# Patient Record
Sex: Male | Born: 1970 | Race: White | Hispanic: No | Marital: Married | Smoking: Never smoker
Health system: Southern US, Community
[De-identification: ages and names within clinical notes are randomized; demographics above are authoritative.]

## PROBLEM LIST (undated history)

## (undated) DIAGNOSIS — E039 Hypothyroidism, unspecified: Secondary | ICD-10-CM

## (undated) DIAGNOSIS — Z8614 Personal history of Methicillin resistant Staphylococcus aureus infection: Secondary | ICD-10-CM

## (undated) DIAGNOSIS — K219 Gastro-esophageal reflux disease without esophagitis: Secondary | ICD-10-CM

## (undated) DIAGNOSIS — M51379 Other intervertebral disc degeneration, lumbosacral region without mention of lumbar back pain or lower extremity pain: Secondary | ICD-10-CM

## (undated) DIAGNOSIS — M4807 Spinal stenosis, lumbosacral region: Secondary | ICD-10-CM

## (undated) DIAGNOSIS — M109 Gout, unspecified: Secondary | ICD-10-CM

## (undated) DIAGNOSIS — Z87442 Personal history of urinary calculi: Secondary | ICD-10-CM

## (undated) DIAGNOSIS — F419 Anxiety disorder, unspecified: Secondary | ICD-10-CM

## (undated) DIAGNOSIS — K760 Fatty (change of) liver, not elsewhere classified: Secondary | ICD-10-CM

## (undated) DIAGNOSIS — K439 Ventral hernia without obstruction or gangrene: Secondary | ICD-10-CM

## (undated) DIAGNOSIS — G4733 Obstructive sleep apnea (adult) (pediatric): Secondary | ICD-10-CM

## (undated) DIAGNOSIS — K7460 Unspecified cirrhosis of liver: Secondary | ICD-10-CM

## (undated) DIAGNOSIS — E079 Disorder of thyroid, unspecified: Secondary | ICD-10-CM

## (undated) HISTORY — PX: VASECTOMY: SHX75

---

## 2004-04-07 HISTORY — PX: TESTICLE TORSION REDUCTION: SHX795

## 2012-01-28 ENCOUNTER — Ambulatory Visit: Payer: Self-pay | Admitting: Family Medicine

## 2012-01-28 LAB — RAPID STREP-A WITH REFLX: Micro Text Report: NEGATIVE

## 2012-02-29 ENCOUNTER — Ambulatory Visit: Payer: Self-pay

## 2012-08-17 ENCOUNTER — Emergency Department: Payer: Self-pay | Admitting: Emergency Medicine

## 2013-02-19 ENCOUNTER — Emergency Department: Payer: Self-pay | Admitting: Emergency Medicine

## 2013-09-23 ENCOUNTER — Ambulatory Visit: Payer: Self-pay | Admitting: Internal Medicine

## 2014-04-07 DIAGNOSIS — M5126 Other intervertebral disc displacement, lumbar region: Secondary | ICD-10-CM

## 2014-04-07 HISTORY — DX: Other intervertebral disc displacement, lumbar region: M51.26

## 2015-05-16 ENCOUNTER — Ambulatory Visit
Admission: EM | Admit: 2015-05-16 | Discharge: 2015-05-16 | Disposition: A | Discharge status: Home / Self Care | Payer: BC Managed Care – PPO | Attending: Family Medicine | Admitting: Family Medicine

## 2015-05-16 ENCOUNTER — Encounter: Payer: Self-pay | Admitting: Emergency Medicine

## 2015-05-16 DIAGNOSIS — J111 Influenza due to unidentified influenza virus with other respiratory manifestations: Secondary | ICD-10-CM | POA: Diagnosis not present

## 2015-05-16 DIAGNOSIS — J069 Acute upper respiratory infection, unspecified: Secondary | ICD-10-CM

## 2015-05-16 HISTORY — DX: Disorder of thyroid, unspecified: E07.9

## 2015-05-16 LAB — RAPID INFLUENZA A&B ANTIGENS (ARMC ONLY)
INFLUENZA A (ARMC): DETECTED
INFLUENZA B (ARMC): NOT DETECTED

## 2015-05-16 LAB — RAPID STREP SCREEN (MED CTR MEBANE ONLY): Streptococcus, Group A Screen (Direct): NEGATIVE

## 2015-05-16 MED ORDER — HYDROCOD POLST-CPM POLST ER 10-8 MG/5ML PO SUER: 5.0000 mL | Freq: Two times a day (BID) | ORAL | Status: DC | PRN

## 2015-05-16 MED ORDER — FEXOFENADINE-PSEUDOEPHED ER 180-240 MG PO TB24: 1.0000 | ORAL_TABLET | Freq: Every day | ORAL | Status: DC

## 2015-05-16 MED ORDER — OSELTAMIVIR PHOSPHATE 75 MG PO CAPS: 75.0000 mg | ORAL_CAPSULE | Freq: Two times a day (BID) | ORAL | Status: DC

## 2015-05-16 NOTE — ED Notes (Signed)
Patient c/o sore throat, cough, sneezing, bodyaches for the past 4 days.

## 2015-05-16 NOTE — Discharge Instructions (Signed)
Influenza, Adult Influenza (flu) is an infection in the mouth, nose, and throat (respiratory tract) caused by a virus. The flu can make you feel very ill. Influenza spreads easily from person to person (contagious).  HOME CARE   Only take medicines as told by your doctor.  Use a cool mist humidifier to make breathing easier.  Get plenty of rest until your fever goes away. This usually takes 3 to 4 days.  Drink enough fluids to keep your pee (urine) clear or pale yellow.  Cover your mouth and nose when you cough or sneeze.  Wash your hands well to avoid spreading the flu.  Stay home from work or school until your fever has been gone for at least 1 full day.  Get a flu shot every year. GET HELP RIGHT AWAY IF:   You have trouble breathing or feel short of breath.  Your skin or nails turn blue.  You have severe neck pain or stiffness.  You have a severe headache, facial pain, or earache.  Your fever gets worse or keeps coming back.  You feel sick to your stomach (nauseous), throw up (vomit), or have watery poop (diarrhea).  You have chest pain.  You have a deep cough that gets worse, or you cough up more thick spit (mucus). MAKE SURE YOU:   Understand these instructions.  Will watch your condition.  Will get help right away if you are not doing well or get worse.   This information is not intended to replace advice given to you by your health care provider. Make sure you discuss any questions you have with your health care provider.   Document Released: 01/01/2008 Document Revised: 04/14/2014 Document Reviewed: 06/23/2011 Elsevier Interactive Patient Education 2016 Elsevier Inc.  Pharyngitis Pharyngitis is a sore throat (pharynx). There is redness, pain, and swelling of your throat. HOME CARE   Drink enough fluids to keep your pee (urine) clear or pale yellow.  Only take medicine as told by your doctor.  You may get sick again if you do not take medicine as told.  Finish your medicines, even if you start to feel better.  Do not take aspirin.  Rest.  Rinse your mouth (gargle) with salt water ( tsp of salt per 1 qt of water) every 1-2 hours. This will help the pain.  If you are not at risk for choking, you can suck on hard candy or sore throat lozenges. GET HELP IF:  You have large, tender lumps on your neck.  You have a rash.  You cough up green, yellow-brown, or bloody spit. GET HELP RIGHT AWAY IF:   You have a stiff neck.  You drool or cannot swallow liquids.  You throw up (vomit) or are not able to keep medicine or liquids down.  You have very bad pain that does not go away with medicine.  You have problems breathing (not from a stuffy nose). MAKE SURE YOU:   Understand these instructions.  Will watch your condition.  Will get help right away if you are not doing well or get worse.   This information is not intended to replace advice given to you by your health care provider. Make sure you discuss any questions you have with your health care provider.   Document Released: 09/10/2007 Document Revised: 01/12/2013 Document Reviewed: 11/29/2012 Elsevier Interactive Patient Education 2016 Elsevier Inc.  Upper Respiratory Infection, Adult Most upper respiratory infections (URIs) are caused by a virus. A URI affects the nose, throat, and  upper air passages. The most common type of URI is often called "the common cold." HOME CARE   Take medicines only as told by your doctor.  Gargle warm saltwater or take cough drops to comfort your throat as told by your doctor.  Use a warm mist humidifier or inhale steam from a shower to increase air moisture. This may make it easier to breathe.  Drink enough fluid to keep your pee (urine) clear or pale yellow.  Eat soups and other clear broths.  Have a healthy diet.  Rest as needed.  Go back to work when your fever is gone or your doctor says it is okay.  You may need to stay home  longer to avoid giving your URI to others.  You can also wear a face mask and wash your hands often to prevent spread of the virus.  Use your inhaler more if you have asthma.  Do not use any tobacco products, including cigarettes, chewing tobacco, or electronic cigarettes. If you need help quitting, ask your doctor. GET HELP IF:  You are getting worse, not better.  Your symptoms are not helped by medicine.  You have chills.  You are getting more short of breath.  You have brown or red mucus.  You have yellow or brown discharge from your nose.  You have pain in your face, especially when you bend forward.  You have a fever.  You have puffy (swollen) neck glands.  You have pain while swallowing.  You have white areas in the back of your throat. GET HELP RIGHT AWAY IF:   You have very bad or constant:  Headache.  Ear pain.  Pain in your forehead, behind your eyes, and over your cheekbones (sinus pain).  Chest pain.  You have long-lasting (chronic) lung disease and any of the following:  Wheezing.  Long-lasting cough.  Coughing up blood.  A change in your usual mucus.  You have a stiff neck.  You have changes in your:  Vision.  Hearing.  Thinking.  Mood. MAKE SURE YOU:   Understand these instructions.  Will watch your condition.  Will get help right away if you are not doing well or get worse.   This information is not intended to replace advice given to you by your health care provider. Make sure you discuss any questions you have with your health care provider.   Document Released: 09/10/2007 Document Revised: 08/08/2014 Document Reviewed: 06/29/2013 Elsevier Interactive Patient Education [employer]

## 2015-05-16 NOTE — ED Provider Notes (Signed)
CSN: [id number]     Arrival date & time 05/16/15  1304 History   First MD Initiated Contact with Patient 05/16/15 1340       Nurses notes were reviewed. Chief Complaint  Patient presents with  . Sore Throat  . Cough  . Generalized Body Aches   Patient reports that afternoon Saturday evening started feeling bad Sunday really hit him and he still sick since then. Since then he's had sore throat nasal congestion coughing aching all over and feeling miserable. His wife had bronchitis about 1 or 2 weeks ago and thus we thought that he may have had. 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 He's never smoked and has some thyroid issues in the past. No pertinent family medical history. He is allergic to penicillin.   (Consider location/radiation/quality/duration/timing/severity/associated sxs/prior Treatment) Patient is a 45 y.o. male presenting with pharyngitis and cough. The history is provided by the patient. No language interpreter was used.  Sore Throat This is a new problem. The current episode started more than 2 days ago. The problem occurs constantly. The problem has been gradually worsening. Associated symptoms include headaches. Pertinent negatives include no chest pain, no abdominal pain and no shortness of breath. Nothing relieves the symptoms. He has tried nothing for the symptoms.  Cough Cough characteristics:  Non-productive and hacking Severity:  Moderate Progression:  Worsening Chronicity:  New Context: upper respiratory infection   Relieved by:  Nothing Worsened by:  Activity Associated symptoms: headaches   Associated symptoms: no chest pain and no shortness of breath     Past Medical History  Diagnosis Date  . Thyroid disease    History reviewed. No pertinent past surgical history. History reviewed. No pertinent family history. Social History  Substance Use Topics  . Smoking status: Never Smoker   . Smokeless tobacco: None  . Alcohol  Use: No    Review of Systems  Respiratory: Positive for cough. Negative for shortness of breath.   Cardiovascular: Negative for chest pain.  Gastrointestinal: Negative for abdominal pain.  Neurological: Positive for headaches.  All other systems reviewed and are negative.   Allergies  Penicillins  Home Medications   Prior to Admission medications   Medication Sig Start Date End Date Taking? Authorizing Provider  ALPRAZolam Prudy Feeler) 0.5 MG tablet Take 0.5 mg by mouth at bedtime as needed for anxiety.   Yes Historical Provider, MD  levothyroxine (SYNTHROID, LEVOTHROID) 50 MCG tablet Take 50 mcg by mouth daily before breakfast.   Yes Historical Provider, MD  ranitidine (ZANTAC) 150 MG tablet Take 150 mg by mouth 2 (two) times daily.   Yes Historical Provider, MD  chlorpheniramine-HYDROcodone (TUSSIONEX PENNKINETIC ER) 10-8 MG/5ML SUER Take 5 mLs by mouth every 12 (twelve) hours as needed for cough. 05/16/15   Hassan Rowan, MD  fexofenadine-pseudoephedrine (ALLEGRA-D ALLERGY & CONGESTION) 180-240 MG 24 hr tablet Take 1 tablet by mouth daily. 05/16/15   Hassan Rowan, MD  oseltamivir (TAMIFLU) 75 MG capsule Take 1 capsule (75 mg total) by mouth 2 (two) times daily. 05/16/15   Hassan Rowan, MD   Meds Ordered and Administered this Visit  Medications - No data to display  BP 132/76 mmHg  Pulse 81  Temp(Src) 98.4 F (36.9 C) (Oral)  Resp 16  Ht  (1.778 m)  Wt 236 lb (107.049 kg)  BMI 33.86 kg/m2  SpO2 98% No data found.   Physical Exam  Constitutional: He is oriented to person, place, and time.  HENT:  Head: Normocephalic and atraumatic.  Right Ear: External ear normal.  Left Ear: External ear normal.  Mouth/Throat: Oropharynx is clear and moist.  Eyes: Conjunctivae are normal. Pupils are equal, round, and reactive to light.  Neck: Normal range of motion. Neck supple. No tracheal deviation present.  Cardiovascular: Normal rate, regular rhythm and normal heart sounds.    Pulmonary/Chest: Effort normal and breath sounds normal. No respiratory distress.  Musculoskeletal: Normal range of motion. He exhibits no tenderness.  Lymphadenopathy:    He has cervical adenopathy.  Neurological: He is alert and oriented to person, place, and time.  Skin: Skin is warm and dry. No erythema.  Psychiatric: He has a normal mood and affect.  Vitals reviewed.   ED Course  Procedures (including critical care time)  Labs Review Labs Reviewed  RAPID STREP SCREEN (NOT AT [facility])  RAPID INFLUENZA A&B ANTIGENS ([facility] ONLY)  CULTURE, GROUP A STREP [facility])    Imaging Review No results found.   Visual Acuity Review  Right Eye Distance:   Left Eye Distance:   Bilateral Distance:    Right Eye Near:   Left Eye Near:    Bilateral Near:    Results for orders placed or performed during the hospital encounter of 05/16/15  Rapid strep screen  Result Value Ref Range   Streptococcus, Group A Screen (Direct) NEGATIVE NEGATIVE  Rapid Influenza A&B Antigens ([facility] only)  Result Value Ref Range   Influenza A ([facility]) DETECTED    Influenza B ([facility]) NOT DETECTED       MDM   1. Flu   2. URI, acute    Even though Flu symptoms have been more than 72 hrs because of the symptoms worsening will place on Tamifu, tussionex, and allegra-D. Return if not better.    Note: This dictation was prepared with Dragon dictation along with smaller phrase technology. Any transcriptional errors that result from this process are unintentional.   Hassan Rowan, MD 05/16/15 1440

## 2015-05-18 LAB — CULTURE, GROUP A STREP (THRC)

## 2015-07-05 ENCOUNTER — Encounter: Payer: Self-pay | Admitting: Emergency Medicine

## 2015-07-05 ENCOUNTER — Ambulatory Visit
Admission: EM | Admit: 2015-07-05 | Discharge: 2015-07-05 | Disposition: A | Discharge status: Home / Self Care | Payer: BC Managed Care – PPO | Attending: Family Medicine | Admitting: Family Medicine

## 2015-07-05 DIAGNOSIS — H1132 Conjunctival hemorrhage, left eye: Secondary | ICD-10-CM | POA: Diagnosis not present

## 2015-07-05 HISTORY — DX: Anxiety disorder, unspecified: F41.9

## 2015-07-05 NOTE — ED Notes (Signed)
Pt presents with left eye redness. Pt states he notices that when he gets really stressed out the blood vessels in his left eye burst has happened three to four times over the last month. Pt is currently taking xanax for anxiety issues. Pt denies any blurred vision, denies any eye pain or drainage. No vision changes.

## 2015-07-05 NOTE — ED Provider Notes (Signed)
CSN: [id number]     Arrival date & time 07/05/15  1445 History   First MD Initiated Contact with Patient 07/05/15 1609     Chief Complaint  Patient presents with  . Eye Problem   (Consider location/radiation/quality/duration/timing/severity/associated sxs/prior Treatment) HPI   A 45 year old gentleman who presents with a left medial eye redness. He states that he has had the same problem before when her to become stressed. This is happened 3 or 4 times before over the last month alone. He was [facility] with [facility] day hospital stay was cleared and he was diagnosed with anxiety even Xanax which she does not take on a frequent basis. He denies any blurred vision or eye pain or drainage. He does wear contacts. He has no pain in his eye. Is no feeling of foreign body and denies any photophobia. He was notified this morning that his son was in trouble at school at adequate work and go to school today without problem and noted shortly afterwards the bleeding on the inner eye.     Past Medical History  Diagnosis Date  . Thyroid disease   . Anxiety    History reviewed. No pertinent past surgical history. No family history on file. Social History  Substance Use Topics  . Smoking status: Never Smoker   . Smokeless tobacco: None  . Alcohol Use: No    Review of Systems  Constitutional: Negative for fever, chills and fatigue.  Eyes: Positive for redness. Negative for photophobia, pain, discharge, itching and visual disturbance.  All other systems reviewed and are negative.   Allergies  Augmentin; Sulfa antibiotics; and Penicillins  Home Medications   Prior to Admission medications   Medication Sig Start Date End Date Taking? Authorizing Provider  ALPRAZolam Prudy Feeler(XANAX) 0.5 MG tablet Take 0.5 mg by mouth at bedtime as needed for anxiety.    Historical Provider, MD  chlorpheniramine-HYDROcodone (TUSSIONEX PENNKINETIC ER) 10-8 MG/5ML SUER Take 5 mLs by mouth every  12 (twelve) hours as needed for cough. 05/16/15   Hassan RowanEugene Wade, MD  fexofenadine-pseudoephedrine (ALLEGRA-D ALLERGY & CONGESTION) 180-240 MG 24 hr tablet Take 1 tablet by mouth daily. 05/16/15   Hassan RowanEugene Wade, MD  levothyroxine (SYNTHROID, LEVOTHROID) 50 MCG tablet Take 50 mcg by mouth daily before breakfast.    Historical Provider, MD  oseltamivir (TAMIFLU) 75 MG capsule Take 1 capsule (75 mg total) by mouth 2 (two) times daily. 05/16/15   Hassan RowanEugene Wade, MD  ranitidine (ZANTAC) 150 MG tablet Take 150 mg by mouth 2 (two) times daily.    Historical Provider, MD   Meds Ordered and Administered this Visit  Medications - No data to display  BP 138/95 mmHg  Pulse 67  Temp(Src) 98.2 F (36.8 C) (Oral)  Resp 18  Ht 5\' 10"  (1.778 m)  Wt 237 lb (107.502 kg)  BMI 34.01 kg/m2  SpO2 99% No data found.   Physical Exam  Constitutional: He is oriented to person, place, and time. He appears well-developed and well-nourished. No distress.  HENT:  Head: Normocephalic and atraumatic.  Right Ear: External ear normal.  Left Ear: External ear normal.  Nose: Nose normal.  Mouth/Throat: Oropharynx is clear and moist.  Eyes: EOM are normal. Pupils are equal, round, and reactive to light. Right eye exhibits no discharge. Left eye exhibits no discharge. No scleral icterus.  Examination of the left eye shows PERRLA. EOMs are intact. All subcutaneous conjunctiva hemorrhage present on the inferior medial surface.  Musculoskeletal:  Normal range of motion. He exhibits no edema or tenderness.  Neurological: He is alert and oriented to person, place, and time.  Skin: Skin is warm and dry. He is not diaphoretic.  Psychiatric: He has a normal mood and affect. His behavior is normal. Judgment and thought content normal.  Nursing note and vitals reviewed.   ED Course  Procedures (including critical care time)  Labs Review Labs Reviewed - No data to display  Imaging Review No results found.   Visual Acuity  Review  Right Eye Distance:   Left Eye Distance:   Bilateral Distance:    Right Eye Near:   Left Eye Near:    Bilateral Near:         MDM   1. Subconjunctival hemorrhage, non-traumatic, left    I reassured the patient that there is no real consequence to the subconjunctival hemorrhage. However I do believe that he would benefit from an examination by an ophthalmologist. Have provided him with the name of [facility]. In the meantime I have asked him not to wear contacts and to use glasses instead. She do this until he is cleared by the ophthalmologist. No medications are necessary today    Lutricia Feil, PA-C 07/05/15 1643

## 2015-07-05 NOTE — Discharge Instructions (Signed)
Subconjunctival Hemorrhage °Subconjunctival hemorrhage is bleeding that happens between the white part of your eye (sclera) and the clear membrane that covers the outside of your eye (conjunctiva). There are many tiny blood vessels near the surface of your eye. A subconjunctival hemorrhage happens when one or more of these vessels breaks and bleeds, causing a red patch to appear on your eye. This is similar to a bruise. °Depending on the amount of bleeding, the red patch may only cover a small area of your eye or it may cover the entire visible part of the sclera. If a lot of blood collects under the conjunctiva, there may also be swelling. Subconjunctival hemorrhages do not affect your vision or cause pain, but your eye may feel irritated if there is swelling. Subconjunctival hemorrhages usually do not require treatment, and they disappear on their own within two weeks. °CAUSES °This condition may be caused by: °· Mild trauma, such as rubbing your eye too hard. °· Severe trauma or blunt injuries. °· Coughing, sneezing, or vomiting. °· Straining, such as when lifting a heavy object. °· High blood pressure. °· Recent eye surgery. °· A history of diabetes. °· Certain medicines, especially blood thinners (anticoagulants). °· Other conditions, such as eye tumors, bleeding disorders, or blood vessel abnormalities. °Subconjunctival hemorrhages can happen without an obvious cause.  °SYMPTOMS  °Symptoms of this condition include: °· A bright red or dark red patch on the white part of the eye. °¨ The red area may spread out to cover a larger area of the eye before it goes away. °¨ The red area may turn brownish-yellow before it goes away. °· Swelling. °· Mild eye irritation. °DIAGNOSIS °This condition is diagnosed with a physical exam. If your subconjunctival hemorrhage was caused by trauma, your health care provider may refer you to an eye specialist (ophthalmologist) or another specialist to check for other injuries. You  may have other tests, including: °· An eye exam. °· A blood pressure check. °· Blood tests to check for bleeding disorders. °If your subconjunctival hemorrhage was caused by trauma, X-rays or a CT scan may be done to check for other injuries. °TREATMENT °Usually, no treatment is needed. Your health care provider may recommend eye drops or cold compresses to help with discomfort. °HOME CARE INSTRUCTIONS °· Take over-the-counter and prescription medicines only as directed by your health care provider. °· Use eye drops or cold compresses to help with discomfort as directed by your health care provider. °· Avoid activities, things, and environments that may irritate or injure your eye. °· Keep all follow-up visits as told by your health care provider. This is important. °SEEK MEDICAL CARE IF: °· You have pain in your eye. °· The bleeding does not go away within 3 weeks. °· You keep getting new subconjunctival hemorrhages. °SEEK IMMEDIATE MEDICAL CARE IF: °· Your vision changes or you have difficulty seeing. °· You suddenly develop severe sensitivity to light. °· You develop a severe headache, persistent vomiting, confusion, or abnormal tiredness (lethargy). °· Your eye seems to bulge or protrude from your eye socket. °· You develop unexplained bruises on your body. °· You have unexplained bleeding in another area of your body. °  °This information is not intended to replace advice given to you by your health care provider. Make sure you discuss any questions you have with your health care provider. °  °Document Released: 03/24/2005 Document Revised: 12/13/2014 Document Reviewed: 05/31/2014 °Elsevier Interactive Patient Education ©2016 Elsevier Inc. ° °

## 2015-10-05 ENCOUNTER — Ambulatory Visit
Admission: EM | Admit: 2015-10-05 | Discharge: 2015-10-05 | Disposition: A | Discharge status: Home / Self Care | Payer: BC Managed Care – PPO | Attending: Family Medicine | Admitting: Family Medicine

## 2015-10-05 ENCOUNTER — Encounter: Payer: Self-pay | Admitting: Emergency Medicine

## 2015-10-05 DIAGNOSIS — M25561 Pain in right knee: Secondary | ICD-10-CM | POA: Diagnosis not present

## 2015-10-05 MED ORDER — MELOXICAM 15 MG PO TABS: 15.0000 mg | ORAL_TABLET | Freq: Every day | ORAL | Status: DC | PRN

## 2015-10-05 MED ORDER — OXYCODONE-ACETAMINOPHEN 5-325 MG PO TABS: 1.0000 | ORAL_TABLET | Freq: Three times a day (TID) | ORAL | Status: DC | PRN

## 2015-10-05 NOTE — Discharge Instructions (Signed)
Take medication as prescribed. Rest. Apply ice and elevate. Use knee immobilizer and crutches as long as pain continues.   Follow up with orthopedic this week.   Follow up with your primary care physician this week as needed. Return to Urgent care for new or worsening concerns.    Knee Pain Knee pain is a very common symptom and can have many causes. Knee pain often goes away when you follow your health care provider's instructions for relieving pain and discomfort at home. However, knee pain can develop into a condition that needs treatment. Some conditions may include:  Arthritis caused by wear and tear (osteoarthritis).  Arthritis caused by swelling and irritation (rheumatoid arthritis or gout).  A cyst or growth in your knee.  An infection in your knee joint.  An injury that will not heal.  Damage, swelling, or irritation of the tissues that support your knee (torn ligaments or tendinitis). If your knee pain continues, additional tests may be ordered to diagnose your condition. Tests may include X-rays or other imaging studies of your knee. You may also need to have fluid removed from your knee. Treatment for ongoing knee pain depends on the cause, but treatment may include:  Medicines to relieve pain or swelling.  Steroid injections in your knee.  Physical therapy.  Surgery. HOME CARE INSTRUCTIONS  Take medicines only as directed by your health care provider.  Rest your knee and keep it raised (elevated) while you are resting.  Do not do things that cause or worsen pain.  Avoid high-impact activities or exercises, such as running, jumping rope, or doing jumping jacks.  Apply ice to the knee area:  Put ice in a plastic bag.  Place a towel between your skin and the bag.  Leave the ice on for 20 minutes, 2-3 times a day.  Ask your health care provider if you should wear an elastic knee support.  Keep a pillow under your knee when you sleep.  Lose weight if you  are overweight. Extra weight can put pressure on your knee.  Do not use any tobacco products, including cigarettes, chewing tobacco, or electronic cigarettes. If you need help quitting, ask your health care provider. Smoking may slow the healing of any bone and joint problems that you may have. SEEK MEDICAL CARE IF:  Your knee pain continues, changes, or gets worse.  You have a fever along with knee pain.  Your knee buckles or locks up.  Your knee becomes more swollen. SEEK IMMEDIATE MEDICAL CARE IF:   Your knee joint feels hot to the touch.  You have chest pain or trouble breathing.   This information is not intended to replace advice given to you by your health care provider. Make sure you discuss any questions you have with your health care provider.   Document Released: 01/19/2007 Document Revised: 04/14/2014 Document Reviewed: 11/07/2013 Elsevier Interactive Patient Education [employer]

## 2015-10-05 NOTE — ED Notes (Signed)
Patient c/o pain in his right knee since yesterday.  Patient was running to get his child who was injured and fell and injured his knee.

## 2015-10-05 NOTE — ED Provider Notes (Signed)
[facility]  ____________________________________________  Time seen: Approximately 7:24 PM  I have reviewed the triage vital signs and the nursing notes.   HISTORY  Chief Complaint Knee Pain  HPI Shawn Sheppard is a 45 y.o. male  patient presents for the complaint of right knee pain since yesterday. Patient states that last night ran outside and went down his front steps, and states he stepped down on the step and when he did this his right knee gave out. Patient reports that he did fall but was able to catch himself with his hands, as he was near the ground, and did not sustain any injury from the fall. Denies any trauma to the knee when he fell. Denies head injury or loss of consciousness. Patient states the injury occurred prior to falling.  Patient reports he's always been athletic and wrestled in college. Patient states that he intermittently has had issues with his right knee in the past. Patient reports that he did have a torn meniscus that he found out about approximately 10 years ago, but did not have to have surgery. Patient reports intermittently his knee does hurt but states since last night his pain is different and persistent. Patient states that pain is present at rest as well as with walking; worse with walking. Patient reports pain onset was directly during stepping down last night. Patient states occasional instability feeling and feeling like his knee is coming give out.  Denies any other pain or injury. Denies any lower action the swelling or posterior knee pain. Denies fevers, recent sickness, redness, swelling, or other injury.  PCP: [facility]   Past Medical History  Diagnosis Date  . Thyroid disease   . Anxiety     There are no active problems to display for this patient.   History reviewed. No pertinent past surgical history.  Current Outpatient Rx  Name  Route  Sig  Dispense  Refill  . ALPRAZolam (XANAX) 0.5 MG tablet   Oral   Take 0.5 mg by  mouth at bedtime as needed for anxiety.         .           .           .           .           .             Allergies Augmentin; Sulfa antibiotics; and Penicillins  History reviewed. No pertinent family history.  Social History Social History  Substance Use Topics  . Smoking status: Never Smoker   . Smokeless tobacco: None  . Alcohol Use: No    Review of Systems Constitutional: No fever/chills Eyes: No visual changes. ENT: No sore throat. Cardiovascular: Denies chest pain. Respiratory: Denies shortness of breath. Gastrointestinal: No abdominal pain.  No nausea, no vomiting.  No diarrhea.  No constipation. Genitourinary: Negative for dysuria. Musculoskeletal: Negative for back pain.Positive right knee pain.  Skin: Negative for rash. Neurological: Negative for headaches, focal weakness or numbness.  10-point ROS otherwise negative.  ____________________________________________   PHYSICAL EXAM:  VITAL SIGNS: ED Triage Vitals  Enc Vitals Group     BP 10/05/15 1846 147/87 mmHg     Pulse Rate 10/05/15 1846 73     Resp 10/05/15 1846 16     Temp 10/05/15 1846 98 F (36.7 C)     Temp Source 10/05/15 1846 Oral     SpO2 10/05/15 1846 99 %  Weight 10/05/15 1846 242 lb (109.77 kg)     Height 10/05/15 1846  (1.778 m)     Head Cir --      Peak Flow --      Pain Score 10/05/15 1850 7     Pain Loc --      Pain Edu? --      Excl. in GC? --     Constitutional: Alert and oriented. Well appearing and in no acute distress. Eyes: Conjunctivae are normal. PERRL. EOMI. Head: Atraumatic.  Mouth/Throat: Mucous membranes are moist.  Oropharynx non-erythematous. Neck: No stridor.  No cervical spine tenderness to palpation.. Cardiovascular: Normal rate, regular rhythm. Grossly normal heart sounds.  Good peripheral circulation. Respiratory: Normal respiratory effort.  No retractions. Lungs CTAB. No wheezes, rales or rhonchi. Gastrointestinal: Soft and  nontender. Musculoskeletal: No lower or upper extremity tenderness nor edema.  Bilateral pedal pulses equal and easily palpated. No cervical, thoracic or lumbar tenderness to palpation. Mild antalgic gait. Except: right knee mild pain with anterior drawer tenderness and some pain with resisted knee extension, mild pain to palpation medial and superior knee, no posterior drawer pain, no pain with medial or lateral stress, no posterior knee pain, no noted swelling, no erythema, no ecchymosis, full range of motion present. No bony tenderness.  Neurologic:  Normal speech and language. No gross focal neurologic deficits are appreciated.  Skin:  Skin is warm, dry and intact. No rash noted. Psychiatric: Mood and affect are normal. Speech and behavior are normal.  ____________________________________________   LABS (all labs ordered are listed, but only abnormal results are displayed)  Labs Reviewed - No data to display  RADIOLOGY  No results found. ____________________________________________   PROCEDURES  Procedure(s) performed: Crutches and knee immobilizer applied by RN. Neurovascular intact post application. _______________________   INITIAL IMPRESSION / ASSESSMENT AND PLAN / ED COURSE  Pertinent labs & imaging results that were available during my care of the patient were reviewed by me and considered in my medical decision making (see chart for details).  Very well-appearing patient. No acute distress. Patient with medial and anterior superior knee pain. Denies direct trauma but reports pain after stepping down when going down steps. Patient reports that his knee intermittently is giving out. Patient with anterior drawer tenderness and some pain with resisted knee extension. No bony tenderness. Discussed in detail. With patient suspect strain injury and possible ligamentous or meniscal injury. Discussed with patient conservative management and follow-up with orthopedic. Patient declines  x-ray at this time and states that he will obtain x-ray with orthopedic next week as needed. Will treat patient with crutches, knee immobilizer as patient states that if he does not use any immobilizer he will not rested as he is a very active person. Will also treat with daily Mobic and when necessary Percocet for breakthrough pain. Encouraged rest, ice, elevation. Information for orthopedic Dr. Joice Lofts given. Discussed indication, risks and benefits of medications with patient.  Discussed follow up with Primary care physician this week. Discussed follow up and return parameters including no resolution or any worsening concerns. Patient verbalized understanding and agreed to plan.   ____________________________________________   FINAL CLINICAL IMPRESSION(S) / ED DIAGNOSES  Final diagnoses:  Right knee pain     Discharge Medication List as of 10/05/2015  7:31 PM    START taking these medications   Details  meloxicam (MOBIC) 15 MG tablet Take 1 tablet (15 mg total) by mouth daily as needed for pain., Starting 10/05/2015, Until Discontinued,  Print    oxyCODONE-acetaminophen (ROXICET) 5-325 MG tablet Take 1 tablet by mouth every 8 (eight) hours as needed for moderate pain or severe pain (Do not drive or operate heavy machinery while taking as can cause drowsiness.)., Starting 10/05/2015, Until Discontinued, Print        Note: This dictation was prepared with Dragon dictation along with smaller phrase technology. Any transcriptional errors that result from this process are unintentional.       Renford DillsLindsey Chrishawn Boley, NP 10/05/15 2046

## 2016-09-08 ENCOUNTER — Ambulatory Visit
Admission: EM | Admit: 2016-09-08 | Discharge: 2016-09-08 | Disposition: A | Discharge status: Home / Self Care | Payer: BC Managed Care – PPO | Attending: Family Medicine | Admitting: Family Medicine

## 2016-09-08 ENCOUNTER — Encounter: Payer: Self-pay | Admitting: Emergency Medicine

## 2016-09-08 ENCOUNTER — Ambulatory Visit (INDEPENDENT_AMBULATORY_CARE_PROVIDER_SITE_OTHER): Payer: BC Managed Care – PPO

## 2016-09-08 DIAGNOSIS — S20211A Contusion of right front wall of thorax, initial encounter: Secondary | ICD-10-CM

## 2016-09-08 DIAGNOSIS — M25521 Pain in right elbow: Secondary | ICD-10-CM

## 2016-09-08 DIAGNOSIS — M79606 Pain in leg, unspecified: Secondary | ICD-10-CM | POA: Diagnosis not present

## 2016-09-08 DIAGNOSIS — T148XXA Other injury of unspecified body region, initial encounter: Secondary | ICD-10-CM

## 2016-09-08 MED ORDER — CYCLOBENZAPRINE HCL 10 MG PO TABS: 10.0000 mg | ORAL_TABLET | Freq: Two times a day (BID) | ORAL | 0 refills | Status: DC | PRN

## 2016-09-08 MED ORDER — MELOXICAM 15 MG PO TABS: 15.0000 mg | ORAL_TABLET | Freq: Every day | ORAL | 0 refills | Status: DC | PRN

## 2016-09-08 MED ORDER — KETOROLAC TROMETHAMINE 30 MG/ML IJ SOLN
30.0000 mg | Freq: Once | INTRAMUSCULAR | Status: AC
  Administered 2016-09-08: 30 mg via INTRAMUSCULAR
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
## 2016-09-08 NOTE — Discharge Instructions (Signed)
Take medication as prescribed. Rest. Drink plenty of fluids. Stretch.  ° °Follow up with your primary care physician this week as needed. Return to Urgent care for new or worsening concerns.  ° °

## 2016-09-08 NOTE — ED Notes (Signed)
Patient shows no signs of adverse reaction to medication at this time.  

## 2017-06-05 ENCOUNTER — Encounter: Payer: Self-pay | Admitting: Emergency Medicine

## 2017-06-05 ENCOUNTER — Other Ambulatory Visit: Payer: Self-pay

## 2017-06-05 ENCOUNTER — Ambulatory Visit
Admission: EM | Admit: 2017-06-05 | Discharge: 2017-06-05 | Disposition: A | Discharge status: Home / Self Care | Payer: BC Managed Care – PPO | Attending: Family Medicine | Admitting: Family Medicine

## 2017-06-05 DIAGNOSIS — L237 Allergic contact dermatitis due to plants, except food: Secondary | ICD-10-CM

## 2017-06-05 DIAGNOSIS — R21 Rash and other nonspecific skin eruption: Secondary | ICD-10-CM | POA: Diagnosis not present

## 2017-06-05 MED ORDER — PREDNISONE 20 MG PO TABS: 20.0000 mg | ORAL_TABLET | Freq: Every day | ORAL | 0 refills | Status: DC

## 2017-06-05 NOTE — ED Provider Notes (Signed)
[facility]    CSN: [id number] Arrival date & time: 06/05/17  1914     History   Chief Complaint Chief Complaint  Patient presents with  . Rash    HPI Shawn Sheppard is a 47 y.o. male.    Rash  Location:  Leg Leg rash location:  R knee (right posterior knee) Severity:  Moderate Onset quality:  Sudden Duration:  1 day Timing:  Constant Progression:  Unchanged Chronicity:  New Context: plant contact   Relieved by:  None tried Ineffective treatments:  None tried Associated symptoms: no abdominal pain, no diarrhea, no fatigue, no fever, no headaches, no hoarse voice, no induration, no joint pain, no myalgias, no nausea, no periorbital edema, no shortness of breath, no sore throat, no throat swelling, no tongue swelling, no URI, not vomiting and not wheezing     Past Medical History:  Diagnosis Date  . Anxiety   . Thyroid disease     There are no active problems to display for this patient.   History reviewed. No pertinent surgical history.     Home Medications    Prior to Admission medications   Medication Sig Start Date End Date Taking? Authorizing Provider  levothyroxine (SYNTHROID, LEVOTHROID) 50 MCG tablet Take 50 mcg by mouth daily before breakfast.   Yes [provider]  loratadine (CLARITIN) 10 MG tablet Take 10 mg by mouth daily.   Yes [provider]  ALPRAZolam Prudy Feeler) 0.5 MG tablet Take 0.5 mg by mouth at bedtime as needed for anxiety.    [provider]  cyclobenzaprine (FLEXERIL) 10 MG tablet Take 1 tablet (10 mg total) by mouth 2 (two) times daily as needed for muscle spasms. Do not drive while taking as can cause drowsiness 09/08/16   Renford Dills, NP  meloxicam (MOBIC) 15 MG tablet Take 1 tablet (15 mg total) by mouth daily as needed. 09/08/16   Renford Dills, NP  predniSONE (DELTASONE) 20 MG tablet Take 1 tablet (20 mg total) by mouth daily. 06/05/17   Payton Mccallum, MD  ranitidine (ZANTAC) 150 MG tablet Take  150 mg by mouth 2 (two) times daily.    [provider]    Family History History reviewed. No pertinent family history.  Social History Social History   Tobacco Use  . Smoking status: Never Smoker  . Smokeless tobacco: Never Used  Substance Use Topics  . Alcohol use: No  . Drug use: No     Allergies   Augmentin [amoxicillin-pot clavulanate]; Sulfa antibiotics; and Penicillins   Review of Systems Review of Systems  Constitutional: Negative for fatigue and fever.  HENT: Negative for hoarse voice and sore throat.   Respiratory: Negative for shortness of breath and wheezing.   Gastrointestinal: Negative for abdominal pain, diarrhea, nausea and vomiting.  Musculoskeletal: Negative for arthralgias and myalgias.  Skin: Positive for rash.  Neurological: Negative for headaches.     Physical Exam Triage Vital Signs ED Triage Vitals  Enc Vitals Group     BP 06/05/17 1939 140/85     Pulse Rate 06/05/17 1939 72     Resp 06/05/17 1939 16     Temp 06/05/17 1939 98.2 F (36.8 C)     Temp Source 06/05/17 1939 Oral     SpO2 06/05/17 1939 99 %     Weight 06/05/17 1936 247 lb (112 kg)     Height 06/05/17 1936 5\' 9"  (1.753 m)     Head Circumference --  Peak Flow --      Pain Score 06/05/17 1936 0     Pain Loc --      Pain Edu? --      Excl. in GC? --    No data found.  Updated Vital Signs BP 140/85 (BP Location: Left Arm)   Pulse 72   Temp 98.2 F (36.8 C) (Oral)   Resp 16   Ht 5\' 9"  (1.753 m)   Wt 247 lb (112 kg)   SpO2 99%   BMI 36.48 kg/m   Visual Acuity Right Eye Distance:   Left Eye Distance:   Bilateral Distance:    Right Eye Near:   Left Eye Near:    Bilateral Near:     Physical Exam  Constitutional: He appears well-developed and well-nourished. No distress.  Skin: Rash noted. Rash is vesicular. He is not diaphoretic. There is erythema.     Nursing note and vitals reviewed.    UC Treatments / Results  Labs (all labs ordered are  listed, but only abnormal results are displayed) Labs Reviewed - No data to display  EKG  EKG Interpretation None       Radiology No results found.  Procedures Procedures (including critical care time)  Medications Ordered in UC Medications - No data to display   Initial Impression / Assessment and Plan / UC Course  I have reviewed the triage vital signs and the nursing notes.  Pertinent labs & imaging results that were available during my care of the patient were reviewed by me and considered in my medical decision making (see chart for details).       Final Clinical Impressions(s) / UC Diagnoses   Final diagnoses:  Rash  Poison oak dermatitis    ED Discharge Orders        Ordered    predniSONE (DELTASONE) 20 MG tablet  Daily     06/05/17 2029     1.  diagnosis reviewed with patient 2. rx as per orders above; reviewed possible side effects, interactions, risks and benefits  3. Recommend supportive treatment with otc antihistamines prn 4. Follow-up prn if symptoms worsen or don't improve  Controlled Substance Prescriptions [place] Controlled Substance Registry consulted? Not Applicable   Payton Mccallumonty, Maitri Schnoebelen, MD 06/05/17 2040

## 2017-06-05 NOTE — ED Triage Notes (Signed)
Patient c/o of itchy rash on the back of his right leg tonight.

## 2017-06-14 ENCOUNTER — Telehealth: Payer: Self-pay

## 2017-06-14 NOTE — Telephone Encounter (Signed)
Pt calls in after finishing his steroids for poison oak. His son was seen as well but received a longer course of steroids than the patient. His rash is not gone and he is wondering if he, too, could have a longer course.

## 2017-06-15 ENCOUNTER — Telehealth: Payer: Self-pay | Admitting: Family Medicine

## 2017-06-15 NOTE — Telephone Encounter (Signed)
Relayed message to TempletonPaula, RN to notify patient he should be re-evaluated if rash not resolved as rx given at visit should have treated his mild rash. Gunnar FusiPaula, RN called and notified patient.

## 2017-06-15 NOTE — Telephone Encounter (Signed)
-----   Message from Isa RankinLaura Wilson Murray, MD sent at 06/14/2017  8:43 PM EDT ----- Regarding: pt requests longer med course   ----- Message ----- From: Ferdie PingWiedenheft, Betsy Lynn, RN Sent: 06/14/2017  12:49 PM To: Isa RankinLaura Wilson Murray, MD  Pt calls in after finishing his steroid for poison oak. His son had same thing but had a longer prescription. He reports the rash is not gone and would like to have an extended course of the steroid like his son. Please advise. I will be out of the clinic on Monday, so please address to general basket if possible. Ty!

## 2017-06-15 NOTE — Telephone Encounter (Signed)
Patient called in stating his poison oak has gotten worse. Spoke with Dr. Judd Gaudieronty and he advised that the patient had two very small places and that 5 days of Steroids should be sufficient if not, he needs to be re-evaluated. Patient stated that he would follow up with his PCP.

## 2018-06-21 ENCOUNTER — Other Ambulatory Visit: Payer: Self-pay | Admitting: Pediatrics

## 2018-06-21 DIAGNOSIS — F5104 Psychophysiologic insomnia: Secondary | ICD-10-CM

## 2018-06-21 DIAGNOSIS — R748 Abnormal levels of other serum enzymes: Secondary | ICD-10-CM

## 2020-02-18 ENCOUNTER — Ambulatory Visit
Admission: EM | Admit: 2020-02-18 | Discharge: 2020-02-18 | Disposition: A | Discharge status: Home / Self Care | Payer: BC Managed Care – PPO | Attending: Emergency Medicine | Admitting: Emergency Medicine

## 2020-02-18 ENCOUNTER — Other Ambulatory Visit: Payer: Self-pay

## 2020-02-18 ENCOUNTER — Encounter: Payer: Self-pay | Admitting: Emergency Medicine

## 2020-02-18 DIAGNOSIS — M109 Gout, unspecified: Secondary | ICD-10-CM

## 2020-02-18 DIAGNOSIS — M25572 Pain in left ankle and joints of left foot: Secondary | ICD-10-CM

## 2020-02-18 MED ORDER — PREDNISONE 20 MG PO TABS: 40.0000 mg | ORAL_TABLET | Freq: Every day | ORAL | 0 refills | Status: AC

## 2020-02-18 MED ORDER — COLCHICINE 0.6 MG PO TABS: ORAL_TABLET | ORAL | 0 refills | Status: DC

## 2020-02-18 MED ORDER — IBUPROFEN 600 MG PO TABS: 600.0000 mg | ORAL_TABLET | Freq: Four times a day (QID) | ORAL | 0 refills | Status: DC | PRN

## 2020-02-18 NOTE — ED Triage Notes (Signed)
Patient c/o left foot pain that started yesterday.  Patient denies injury or fall.  

## 2020-02-18 NOTE — Discharge Instructions (Addendum)
Follow-up with [facility] as soon as you can. They may do an aspiration of the joint to confirm the presumed diagnosis of gout. Prednisone will also help with your elbow. Take the colchicine as directed. 600 mg of ibuprofen combined with 1000 g of Tylenol together 3-4 times a day as needed for pain. Wear the Aircast as needed for comfort. May put something cold in between the Aircast and a sock. Do not let the cold pack touch your skin. If this truly is gout, you may benefit from being on allopurinol or something to prevent recurrent attacks. Go immediately to the ER for fevers above 100.4, diffuse swelling and redness of your ankle, inability to move your ankle, body aches, or other concerns.

## 2020-02-18 NOTE — ED Provider Notes (Signed)
HPI  SUBJECTIVE:  Shawn Sheppard is a 49 y.o. male who presents with left lateral ankle and foot pain starting yesterday.  He describes hypersensitivity, swelling in his lateral ankle.  He describes as constant achiness, pressure.  It radiates to the Achilles tendon.  He states that he had similar pain like this before when diagnosed with gout in the ED 3 weeks ago, but last time the pain was located along the top of his foot as well as in his ankle.  He is on his feet all day long, but denies change in his physical activity.  No trauma.  No fevers, body aches, numbness or tingling of the foot, bruising.  He reports foot erythema.  Not sure if there is increased temperature.  No recent viral illness.  He denies alcohol use.  States that he has eliminated red meat from his diet.  He has tried elevation, ibuprofen 800 mg.  Symptoms are better with ice and rest, worse with weightbearing, plantarflexion and having his foot underneath sheet or wearing a sock.  Patient was seen in the ER on 01/25/2020 for identical left ankle and foot pain, had ultrasound left lower extremity, x-ray of the ankle and foot, sedimentation rate, CBC, CRP, BMP and uric acid.  Kidney function was normal, uric acid was elevated at 8.9.  All other labs were normal.  Imaging was reassuring-no DVT or fracture.  Thought to have gout, was prescribed colchicine and ibuprofen.  States that the colchicine did not immediately help but that the ibuprofen did.  Past medical history negative for rheumatoid arthritis, chronic kidney disease, diabetes, hypertension, neuropathy, peripheral arterial disease, peripheral vascular disease.  No previous history of left ankle injury.  He reports a remote history of left foot fracture.  He is not on any diuretics.  He has a past medical history of hypothyroidism.  PMD: [facility] [place].  Orthopedics: [facility].  He has seen them for his left elbow which continues to bother him since  July.  It is not any different today.   Past Medical History:  Diagnosis Date  . Anxiety   . Thyroid disease     History reviewed. No pertinent surgical history.  History reviewed. No pertinent family history.  Social History   Tobacco Use  . Smoking status: Never Smoker  . Smokeless tobacco: Never Used  Vaping Use  . Vaping Use: Never used  Substance Use Topics  . Alcohol use: No  . Drug use: No    No current facility-administered medications for this encounter.  Current Outpatient Medications:  .  levothyroxine (SYNTHROID, LEVOTHROID) 50 MCG tablet, Take 50 mcg by mouth daily before breakfast., Disp: , Rfl:  .  sertraline (ZOLOFT) 100 MG tablet, Take 100 mg by mouth daily., Disp: , Rfl:  .  sildenafil (VIAGRA) 25 MG tablet, Take 1 tab PO every day AS NEEDED for erectile dysfunction, Disp: , Rfl:  .  colchicine 0.6 MG tablet, 2 tabs po x 1, then one tab po 1 hour later, Disp: 6 tablet, Rfl: 0 .  ibuprofen (ADVIL) 600 MG tablet, Take 1 tablet (600 mg total) by mouth every 6 (six) hours as needed., Disp: 30 tablet, Rfl: 0 .  predniSONE (DELTASONE) 20 MG tablet, Take 2 tablets (40 mg total) by mouth daily with breakfast for 5 days., Disp: 10 tablet, Rfl: 0 .  ranitidine (ZANTAC) 150 MG tablet, Take 150 mg by mouth 2 (two) times daily., Disp: , Rfl:   Allergies  Allergen Reactions  . Augmentin [Amoxicillin-Pot Clavulanate]   . Sulfa Antibiotics   . Penicillins Rash     ROS  As noted in HPI.   Physical Exam  BP (!) 135/92 (BP Location: Left Arm)   Pulse (!) 58   Temp 98.2 F (36.8 C) (Oral)   Resp 16   Ht 5\' 9"  (1.753 m)   Wt 112 kg   SpO2 98%   BMI 36.46 kg/m   Constitutional: Well developed, well nourished, no acute distress Eyes:  EOMI, conjunctiva normal bilaterally HENT: Normocephalic, atraumatic,mucus membranes moist Respiratory: Normal inspiratory effort Cardiovascular: Normal rate GI: nondistended skin: No rash, skin intact Musculoskeletal:  L  Ankle.  Lateral soft tissue swelling.  Erythematous foot,  temperature about the same as the other ankle/foot.  Proximal fibula NT, Distal fibula NT , Medial malleolus NT ,  Deltoid ligaments NT ,  ATFL tender, calcaneofibular ligament  tender, posterior tablofibular ligament  NT ,  Achilles tender at the insertion.  No appreciable soft tissue defect., calcaneus NT, Proximal 5th metatarsal NT, Midfoot NT, distal NVI with baseline sensation / motor to foot with DP 2+.  Pain with plantarflexion.  No pain with dorsiflexion, inversion/eversion. - bruising. - squeeze test.  Ant drawer test stable. Pt with difficulty bearing weight..  Hypersensitive to light touch. Neurologic: Alert & oriented x 3, no focal neuro deficits Psychiatric: Speech and behavior appropriate   ED Course   Medications - No data to display  Orders Placed This Encounter  Procedures  . Apply air cast ankle brace    Standing Status:   Standing    Number of Occurrences:   1    Order Specific Question:   Laterality    Answer:   Left    No results found for this or any previous visit (from the past 24 hour(s)). No results found.  ED Clinical Impression  1. Acute left ankle pain   2. Acute gout of left ankle, unspecified cause      ED Assessment/Plan  Outside records, labs reviewed.  As noted in HPI.  Presentation consistent with recurrent gout flare in the ankle especially with the hypersensitivity and recent elevated uric acid.  States the pain is very similar to the flare that he had 3 weeks ago, but it is in a different location now. Discussed with him that he may benefit from being on allopurinol. He does have lateral soft tissue swelling and lateral ligamentous tenderness, but he denies trauma.  He has no bony tenderness or history of trauma thus imaging was deferred especially with normal x-rays of the foot and ankle 3 weeks ago.  Doubt septic joint as I am able to move it through some range of motion without pain.  his pain is primarily with dorsiflexion.  He has had no fevers, body aches or other systemic symptoms.  Will try decreasing ibuprofen from 800 to 600 mg.  He is to take this with 1000 mg of Tylenol 3-4 times a day as needed for pain.   will send him home on some prednisone, colchicine 2 doses, Aircast so that he can hold a cool pad on his ankle.  Discussed that he needs to follow-up with [facility] ASAP for definitive diagnosis.  Strict ER return precautions given.  His left elbow pain is unchanged since July. He will need to follow-up with [facility] about this as well.  Discussed labs and imaging from previous ER visit, MDM, treatment plan, and plan for follow-up with  patient and spouse.  Discussed sn/sx that should prompt return to the ED. they agree with plan.   Meds ordered this encounter  Medications  . colchicine 0.6 MG tablet    Sig: 2 tabs po x 1, then one tab po 1 hour later    Dispense:  6 tablet    Refill:  0  . predniSONE (DELTASONE) 20 MG tablet    Sig: Take 2 tablets (40 mg total) by mouth daily with breakfast for 5 days.    Dispense:  10 tablet    Refill:  0  . ibuprofen (ADVIL) 600 MG tablet    Sig: Take 1 tablet (600 mg total) by mouth every 6 (six) hours as needed.    Dispense:  30 tablet    Refill:  0    *This clinic note was created using Scientist, clinical (histocompatibility and immunogenetics). Therefore, there may be occasional mistakes despite careful proofreading.   ?    Domenick Gong, MD 02/18/20 [phone]

## 2020-02-25 ENCOUNTER — Ambulatory Visit
Admission: EM | Admit: 2020-02-25 | Discharge: 2020-02-25 | Disposition: A | Discharge status: Home / Self Care | Payer: BC Managed Care – PPO | Attending: Family Medicine | Admitting: Family Medicine

## 2020-02-25 ENCOUNTER — Encounter: Payer: Self-pay | Admitting: Emergency Medicine

## 2020-02-25 ENCOUNTER — Other Ambulatory Visit: Payer: Self-pay

## 2020-02-25 DIAGNOSIS — S46201A Unspecified injury of muscle, fascia and tendon of other parts of biceps, right arm, initial encounter: Secondary | ICD-10-CM

## 2020-02-25 MED ORDER — HYDROCODONE-ACETAMINOPHEN 5-325 MG PO TABS: 1.0000 | ORAL_TABLET | Freq: Three times a day (TID) | ORAL | 0 refills | Status: DC | PRN

## 2020-02-25 NOTE — ED Triage Notes (Signed)
Patient states he was moving a wrestling mat this morning and feels like he did something with his right bicep.

## 2020-02-25 NOTE — ED Provider Notes (Signed)
[facility]    CSN: [id number] Arrival date & time: 02/25/20  1032  History   Chief Complaint Chief Complaint  Patient presents with  . Arm Injury    right   HPI  49 year old male presents with the above complaint.  Patient states that he was moving a wrestling mat this morning.  He states that he felt a pop of his right upper extremity at the attachment site of the bicep.  He believes that he has torn his bicep.  He is having difficulty moving his arm.  He has swelling and severe pain.  Pain currently 10/10 in severity.  No relieving factors.  No other complaints.  Past Medical History:  Diagnosis Date  . Anxiety   . Thyroid disease    Home Medications    Prior to Admission medications   Medication Sig Start Date End Date Taking? Authorizing Provider  ibuprofen (ADVIL) 600 MG tablet Take 1 tablet (600 mg total) by mouth every 6 (six) hours as needed. 02/18/20  Yes Domenick Gong, MD  levothyroxine (SYNTHROID, LEVOTHROID) 50 MCG tablet Take 50 mcg by mouth daily before breakfast.   Yes [provider]  sertraline (ZOLOFT) 100 MG tablet Take 100 mg by mouth daily. 01/31/20  Yes [provider]  HYDROcodone-acetaminophen (NORCO/VICODIN) 5-325 MG tablet Take 1 tablet by mouth every 8 (eight) hours as needed for moderate pain or severe pain. 02/25/20   Tommie Sams, DO  ranitidine (ZANTAC) 150 MG tablet Take 150 mg by mouth 2 (two) times daily.    [provider]  sildenafil (VIAGRA) 25 MG tablet Take 1 tab PO every day AS NEEDED for erectile dysfunction 01/04/20   [provider]  colchicine 0.6 MG tablet 2 tabs po x 1, then one tab po 1 hour later 02/18/20 02/25/20  Domenick Gong, MD  loratadine (CLARITIN) 10 MG tablet Take 10 mg by mouth daily.  02/18/20  [provider]   Social History Social History   Tobacco Use  . Smoking status: Never Smoker  . Smokeless tobacco: Never Used  Vaping Use  . Vaping Use:  Never used  Substance Use Topics  . Alcohol use: No  . Drug use: No     Allergies   Augmentin [amoxicillin-pot clavulanate], Sulfa antibiotics, and Penicillins   Review of Systems Review of Systems  Musculoskeletal:       Concern for bicep tear.   Physical Exam Triage Vital Signs ED Triage Vitals  Enc Vitals Group     BP 02/25/20 1051 (!) 139/95     Pulse Rate 02/25/20 1051 64     Resp 02/25/20 1051 18     Temp 02/25/20 1051 98.2 F (36.8 C)     Temp Source 02/25/20 1051 Oral     SpO2 02/25/20 1051 98 %     Weight 02/25/20 1049 245 lb (111.1 kg)     Height 02/25/20 1049 5\' 9"  (1.753 m)     Head Circumference --      Peak Flow --      Pain Score 02/25/20 1049 10     Pain Loc --      Pain Edu? --      Excl. in GC? --    Updated Vital Signs BP (!) 139/95 (BP Location: Right Arm)   Pulse 64   Temp 98.2 F (36.8 C) (Oral)   Resp 18   Ht 5\' 9"  (1.753 m)   Wt 111.1 kg   SpO2 98%  BMI 36.18 kg/m   Visual Acuity Right Eye Distance:   Left Eye Distance:   Bilateral Distance:    Right Eye Near:   Left Eye Near:    Bilateral Near:     Physical Exam Constitutional:      General: He is not in acute distress.    Appearance: Normal appearance. He is not ill-appearing.  HENT:     Head: Normocephalic and atraumatic.  Eyes:     General:        Right eye: No discharge.        Left eye: No discharge.     Conjunctiva/sclera: Conjunctivae normal.  Pulmonary:     Effort: Pulmonary effort is normal. No respiratory distress.  Musculoskeletal:     Comments: Right arm -exam limited due to pain.  Compared to the left arm, there appears to be a defect at the distal attachment of the bicep.  Bicep muscle appears to be retracted upward.  Neurological:     Mental Status: He is alert.  Psychiatric:        Mood and Affect: Mood normal.        Behavior: Behavior normal.    UC Treatments / Results  Labs (all labs ordered are listed, but only abnormal results are  displayed) Labs Reviewed - No data to display  EKG   Radiology No results found.  Procedures Procedures (including critical care time)  Medications Ordered in UC Medications - No data to display  Initial Impression / Assessment and Plan / UC Course  I have reviewed the triage vital signs and the nursing notes.  Pertinent labs & imaging results that were available during my care of the patient were reviewed by me and considered in my medical decision making (see chart for details).    48 year old male presents with a suspected tear of the biceps tendon versus tear of the biceps muscle.  Hydrocodone as needed for pain.  Placed in sling for comfort.  Patient has an orthopedist.  Advised to call on Monday for evaluation.  Will likely need surgical repair.  Final Clinical Impressions(s) / UC Diagnoses   Final diagnoses:  Unspecified injury of muscle, fascia and tendon of other parts of biceps, right arm, initial encounter     Discharge Instructions     Medication as prescribed.  Rest, ice, elevation.  Call [facility] as soon as you can.  Take care  Dr. Adriana Simas    ED Prescriptions    Medication Sig Dispense Auth. Provider   HYDROcodone-acetaminophen (NORCO/VICODIN) 5-325 MG tablet Take 1 tablet by mouth every 8 (eight) hours as needed for moderate pain or severe pain. 15 tablet Everlene Other G, DO     I have reviewed the PDMP during this encounter.   Tommie Sams, DO 02/25/20 1430

## 2020-02-25 NOTE — Discharge Instructions (Signed)
Medication as prescribed.  Rest, ice, elevation.  Call [facility] as soon as you can.  Take care  Dr. Adriana Simas

## 2020-02-28 ENCOUNTER — Other Ambulatory Visit: Payer: Self-pay | Admitting: Sports Medicine

## 2020-02-28 DIAGNOSIS — S59901A Unspecified injury of right elbow, initial encounter: Secondary | ICD-10-CM

## 2020-02-29 ENCOUNTER — Ambulatory Visit
Admission: RE | Admit: 2020-02-29 | Discharge: 2020-02-29 | Disposition: A | Discharge status: Home / Self Care | Payer: BC Managed Care – PPO | Source: Ambulatory Visit | Attending: Sports Medicine | Admitting: Sports Medicine

## 2020-02-29 ENCOUNTER — Other Ambulatory Visit: Payer: Self-pay

## 2020-02-29 DIAGNOSIS — S59901A Unspecified injury of right elbow, initial encounter: Secondary | ICD-10-CM

## 2020-03-05 ENCOUNTER — Other Ambulatory Visit: Payer: Self-pay | Admitting: Surgery

## 2020-03-05 ENCOUNTER — Inpatient Hospital Stay
Admission: RE | Admit: 2020-03-05 | Discharge: 2020-03-05 | Disposition: A | Discharge status: Home / Self Care | Payer: BC Managed Care – PPO | Source: Ambulatory Visit

## 2020-03-05 NOTE — Pre-Procedure Instructions (Signed)
Attempted to call, for PAT interview but no answer and no voice mail.

## 2020-03-06 ENCOUNTER — Other Ambulatory Visit
Admission: RE | Admit: 2020-03-06 | Discharge: 2020-03-06 | Disposition: A | Discharge status: Home / Self Care | Payer: BC Managed Care – PPO | Source: Ambulatory Visit | Attending: Surgery | Admitting: Surgery

## 2020-03-06 ENCOUNTER — Other Ambulatory Visit: Payer: Self-pay

## 2020-03-06 ENCOUNTER — Other Ambulatory Visit: Payer: BC Managed Care – PPO

## 2020-03-06 DIAGNOSIS — K219 Gastro-esophageal reflux disease without esophagitis: Secondary | ICD-10-CM | POA: Diagnosis not present

## 2020-03-06 DIAGNOSIS — X58XXXA Exposure to other specified factors, initial encounter: Secondary | ICD-10-CM | POA: Insufficient documentation

## 2020-03-06 DIAGNOSIS — Z20822 Contact with and (suspected) exposure to covid-19: Secondary | ICD-10-CM | POA: Insufficient documentation

## 2020-03-06 DIAGNOSIS — Z01812 Encounter for preprocedural laboratory examination: Secondary | ICD-10-CM | POA: Insufficient documentation

## 2020-03-06 DIAGNOSIS — E669 Obesity, unspecified: Secondary | ICD-10-CM | POA: Diagnosis not present

## 2020-03-06 DIAGNOSIS — Z880 Allergy status to penicillin: Secondary | ICD-10-CM | POA: Insufficient documentation

## 2020-03-06 DIAGNOSIS — Y9372 Activity, wrestling: Secondary | ICD-10-CM | POA: Diagnosis not present

## 2020-03-06 DIAGNOSIS — Z79899 Other long term (current) drug therapy: Secondary | ICD-10-CM | POA: Diagnosis not present

## 2020-03-06 DIAGNOSIS — Z6835 Body mass index (BMI) 35.0-35.9, adult: Secondary | ICD-10-CM | POA: Diagnosis not present

## 2020-03-06 DIAGNOSIS — S46211A Strain of muscle, fascia and tendon of other parts of biceps, right arm, initial encounter: Secondary | ICD-10-CM | POA: Insufficient documentation

## 2020-03-06 DIAGNOSIS — Z791 Long term (current) use of non-steroidal anti-inflammatories (NSAID): Secondary | ICD-10-CM | POA: Diagnosis not present

## 2020-03-06 HISTORY — DX: Hypothyroidism, unspecified: E03.9

## 2020-03-06 LAB — SARS CORONAVIRUS 2 (TAT 6-24 HRS): SARS Coronavirus 2: NEGATIVE

## 2020-03-06 NOTE — Progress Notes (Signed)
[facility] Perioperative Services: Pre-Admission/Anesthesia Testing   Date: 03/06/20 Name: Shawn Sheppard MRN:   [id number]  Re: Consideration of preoperative prophylactic antibiotic change   Request sent to: Poggi, Excell Seltzer, MD (routed and/or faxed via [facility])  Planned Surgical Procedure(s):    Case: [id number] Date/Time: 03/08/20 1313   Procedure: DISTAL BICEPS TENDON REPAIR (Right Elbow)   Anesthesia type: Choice   Pre-op diagnosis: Rupture of right biceps tendon, initial encounter S46.211A   Location: [facility] OR ROOM 03 / [facility]   Surgeons: Christena Flake, MD    Notes: 1. Patient has a documented allergy to PCN  . Advising that PCN has caused him to experience low severity rash in the past described as "red bumps on forehead and blotchy red rash".  2. Screened as appropriate for cephalosporin use during medication reconciliation . No immediate angioedema, dysphagia, SOB, anaphylaxis symptoms. . No severe rash involving mucous membranes or skin necrosis. . No hospital admissions related to side effects of PCN/cephalosporin use.  . No documented reaction to PCN or cephalosporin in the last 10 years.  Request:  As an evidence based approach to reducing the rate of incidence for post-operative SSI and the development of MDROs, could an agent with narrower coverage for preoperative prophylaxis in this patient's upcoming surgical course be considered?  1. Currently ordered preoperative prophylactic ABX: clindamycin.   2. Specifically requesting change to cephalosporin (CEFAZOLIN).   3. Please communicate decision with me and I will change the orders in Epic as per your direction.   Things to consider:  Many patients report that they were "allergic" to PCN earlier in life, however this does not translate into a true lifelong allergy. Patients can lose sensitivity to specific IgE antibodies over time if PCN is avoided (Kleris & Lugar, 2019).   Up  to 10% of the adult population and 15% of hospitalized patients report an allergy to PCN, however clinical studies suggest that 90% of those reporting an allergy can tolerate PCN antibiotics (Kleris & Lugar, 2019).   Cross-sensitivity between PCN and cephalosporins has been documented as being as high as 10%, however this estimation included data believed to have been collected in a setting where there was contamination. Newer data suggests that the prevalence of cross-sensitivity between PCN and cephalosporins is actually estimated to be closer to 1% (Hermanides et al., 2018).    Patients labeled as PCN allergic, whether they are truly allergic or not, have been found to have inferior outcomes in terms of rates of serious infection, and these patients tend to have longer hospital stays [facility] & Lugar, 2019).   Treatment related secondary infections, such as Clostridioides difficile, have been linked to the improper use of broad spectrum antibiotics in patients improperly labeled as PCN allergic (Kleris & Lugar, 2019).   Anaphylaxis from cephalosporins is rare and the evidence suggests that there is no increased risk of an anaphylactic type reaction when cephalosporins are used in a PCN allergic patient (Pichichero, 2006).  Citations: Hermanides J, Lemkes BA, Prins Gwenyth Bender MW, Terreehorst I. Presumed ?-Lactam Allergy and Cross-reactivity in the Operating Theater: A Practical Approach. Anesthesiology. 2018 Aug;129(2):335-342. doi: 10.1097/ALN.0000000000002252. PMID: 84536468.  Kleris, R. S., & Lugar, P. L. (2019). Things We Do For No Reason: Failing to Question a Penicillin Allergy History. Journal of hospital medicine, 14(10), 6392232458. Advance online publication. airportbarriers.com  Pichichero, M. E. (2006). Cephalosporins can be prescribed safely for penicillin-allergic patients. Journal of family medicine, 55(2), 106-112. Accessed:  https://cdn.mdedge.com/files/s62fs-public/Document/September-2017/5502JFP_AppliedEvidence1.pdf   [name], MSN, APRN, FNP-C, CEN [facility]  Peri-operative Services Nurse Practitioner 03/06/20 9:50 PM

## 2020-03-06 NOTE — Patient Instructions (Addendum)
Your procedure is scheduled on: Thursday March 08, 2020. Report to Day Surgery inside [facility] 2nd floor (stop by registration desk first). To find out your arrival time please call [phone] between 1PM - 3PM on Wednesday March 07, 2020.  Remember: Instructions that are not followed completely may result in serious medical risk,  up to and including death, or upon the discretion of your surgeon and anesthesiologist your  surgery may need to be rescheduled.     _X__ 1. Do not eat food after midnight the night before your procedure.                 No chewing gum or hard candies. You may drink clear liquids up to 2 hours                 before you are scheduled to arrive for your surgery- DO not drink clear                 liquids within 2 hours of the start of your surgery.                 Clear Liquids include:  water, apple juice without pulp, clear Gatorade, G2 or                  Gatorade Zero (avoid Red/Purple/Blue), Black Coffee or Tea (Do not add                 anything to coffee or tea).  __X__2.   Complete the "Ensure Clear Pre-surgery Clear Carbohydrate Drink" provided to you, 2 hours before arrival.   __X__3.  On the morning of surgery brush your teeth with toothpaste and water, you                may rinse your mouth with mouthwash if you wish.  Do not swallow any toothpaste of mouthwash.     _X__ 4.  No Alcohol for 24 hours before or after surgery.   _X__ 5.  Do Not Smoke or use e-cigarettes For 24 Hours Prior to Your Surgery.                 Do not use any chewable tobacco products for at least 6 hours prior to                 Surgery.  _X__  6.  Do not use any recreational drugs (marijuana, cocaine, heroin, ecstasy, MDMA or other)                For at least one week prior to your surgery.  Combination of these drugs with anesthesia                May have life threatening results.  __X__7.  Notify your doctor if there is any change  in your medical condition      (cold, fever, infections).     Do not wear jewelry, make-up, hairpins, clips or nail polish. Do not wear lotions, powders, or perfumes. You may wear deodorant. Do not shave 48 hours prior to surgery. Men may shave face and neck. Do not bring valuables to the hospital.    [facility] is not responsible for any belongings or valuables.  Contacts, dentures or bridgework may not be worn into surgery. Leave your suitcase in the car. After surgery it may be brought to your room. For patients admitted to the hospital, discharge time is determined by your treatment  team.   Patients discharged the day of surgery will not be allowed to drive home.   Make arrangements for someone to be with you for the first 24 hours of your Same Day Discharge.   __X__ Take these medicines the morning of surgery with A SIP OF WATER:    1. levothyroxine (SYNTHROID, LEVOTHROID) 50 MCG   2. HYDROcodone-acetaminophen (NORCO/VICODIN)  3. sertraline (ZOLOFT) 100 MG     ____ Fleet Enema (as directed)   __X__ Use CHG Soap (or wipes) as directed  ____ Use Benzoyl Peroxide Gel as instructed  __X__ Stop Anti-inflammatories such as ibuprofen (ADVIL), Aleve, naproxen, aspirin and or BC powders.   __X__ Stop supplements until after surgery.    __X__ Do not start any herbal supplements before your procedure.     If you have any questions regarding your pre-procedure instructions,  Please call Pre-admit Testing at [phone].

## 2020-03-07 MED ORDER — ORAL CARE MOUTH RINSE: 15.0000 mL | Freq: Once | OROMUCOSAL | Status: AC

## 2020-03-07 MED ORDER — FAMOTIDINE 20 MG PO TABS: 20.0000 mg | ORAL_TABLET | Freq: Once | ORAL | Status: AC

## 2020-03-07 MED ORDER — CHLORHEXIDINE GLUCONATE 0.12 % MT SOLN: 15.0000 mL | Freq: Once | OROMUCOSAL | Status: AC

## 2020-03-07 MED ORDER — CLINDAMYCIN PHOSPHATE 900 MG/50ML IV SOLN: 900.0000 mg | INTRAVENOUS | Status: DC

## 2020-03-07 MED ORDER — LACTATED RINGERS IV SOLN: INTRAVENOUS | Status: DC

## 2020-03-08 ENCOUNTER — Ambulatory Visit
Admission: RE | Admit: 2020-03-08 | Discharge: 2020-03-08 | Disposition: A | Discharge status: Home / Self Care | Payer: BC Managed Care – PPO | Attending: Surgery | Admitting: Surgery

## 2020-03-08 ENCOUNTER — Ambulatory Visit: Payer: BC Managed Care – PPO | Admitting: Urgent Care

## 2020-03-08 ENCOUNTER — Encounter: Payer: Self-pay | Admitting: Surgery

## 2020-03-08 ENCOUNTER — Encounter
Admission: RE | Disposition: A | Discharge status: Home / Self Care | Payer: Self-pay | Source: Home / Self Care | Attending: Surgery

## 2020-03-08 ENCOUNTER — Ambulatory Visit: Payer: BC Managed Care – PPO

## 2020-03-08 DIAGNOSIS — Z79899 Other long term (current) drug therapy: Secondary | ICD-10-CM | POA: Insufficient documentation

## 2020-03-08 DIAGNOSIS — Z6835 Body mass index (BMI) 35.0-35.9, adult: Secondary | ICD-10-CM | POA: Insufficient documentation

## 2020-03-08 DIAGNOSIS — S46211A Strain of muscle, fascia and tendon of other parts of biceps, right arm, initial encounter: Secondary | ICD-10-CM | POA: Diagnosis not present

## 2020-03-08 DIAGNOSIS — K219 Gastro-esophageal reflux disease without esophagitis: Secondary | ICD-10-CM | POA: Insufficient documentation

## 2020-03-08 DIAGNOSIS — Z791 Long term (current) use of non-steroidal anti-inflammatories (NSAID): Secondary | ICD-10-CM | POA: Insufficient documentation

## 2020-03-08 DIAGNOSIS — E669 Obesity, unspecified: Secondary | ICD-10-CM | POA: Insufficient documentation

## 2020-03-08 DIAGNOSIS — Z20822 Contact with and (suspected) exposure to covid-19: Secondary | ICD-10-CM | POA: Insufficient documentation

## 2020-03-08 DIAGNOSIS — Z419 Encounter for procedure for purposes other than remedying health state, unspecified: Secondary | ICD-10-CM

## 2020-03-08 DIAGNOSIS — X58XXXA Exposure to other specified factors, initial encounter: Secondary | ICD-10-CM | POA: Insufficient documentation

## 2020-03-08 DIAGNOSIS — Y9372 Activity, wrestling: Secondary | ICD-10-CM | POA: Insufficient documentation

## 2020-03-08 HISTORY — PX: DISTAL BICEPS TENDON REPAIR: SHX1461

## 2020-03-08 SURGERY — REPAIR, TENDON, BICEPS, DISTAL
Anesthesia: Regional | Site: Elbow | Laterality: Right

## 2020-03-08 MED ORDER — MIDAZOLAM HCL 2 MG/2ML IJ SOLN
INTRAMUSCULAR | Status: DC | PRN
  Administered 2020-03-08: 2 mg via INTRAVENOUS

## 2020-03-08 MED ORDER — CEFAZOLIN SODIUM-DEXTROSE 2-4 GM/100ML-% IV SOLN
2.0000 g | Freq: Once | INTRAVENOUS | Status: AC
  Administered 2020-03-08: 2 g via INTRAVENOUS

## 2020-03-08 MED ORDER — FENTANYL CITRATE (PF) 100 MCG/2ML IJ SOLN
INTRAMUSCULAR | Status: DC | PRN
  Administered 2020-03-08: 50 ug via INTRAVENOUS

## 2020-03-08 MED ORDER — OXYCODONE HCL 5 MG PO TABS: 5.0000 mg | ORAL_TABLET | Freq: Once | ORAL | Status: DC | PRN

## 2020-03-08 MED ORDER — PROPOFOL 10 MG/ML IV BOLUS
INTRAVENOUS | Status: DC | PRN
  Administered 2020-03-08: 200 mg via INTRAVENOUS

## 2020-03-08 MED ORDER — FAMOTIDINE 20 MG PO TABS
ORAL_TABLET | ORAL | Status: AC
  Administered 2020-03-08: 20 mg via ORAL
  Filled 2020-03-08: qty 1

## 2020-03-08 MED ORDER — BUPIVACAINE-EPINEPHRINE 0.5% -1:200000 IJ SOLN
INTRAMUSCULAR | Status: DC | PRN
  Administered 2020-03-08: 10 mL

## 2020-03-08 MED ORDER — PROPOFOL 10 MG/ML IV BOLUS
INTRAVENOUS | Status: AC
  Filled 2020-03-08: qty 20

## 2020-03-08 MED ORDER — CLINDAMYCIN PHOSPHATE 900 MG/50ML IV SOLN
INTRAVENOUS | Status: AC
  Filled 2020-03-08: qty 50

## 2020-03-08 MED ORDER — LIDOCAINE HCL (CARDIAC) PF 100 MG/5ML IV SOSY
PREFILLED_SYRINGE | INTRAVENOUS | Status: DC | PRN
  Administered 2020-03-08: 100 mg via INTRAVENOUS

## 2020-03-08 MED ORDER — ONDANSETRON HCL 4 MG/2ML IJ SOLN
INTRAMUSCULAR | Status: DC | PRN
  Administered 2020-03-08: 4 mg via INTRAVENOUS

## 2020-03-08 MED ORDER — FENTANYL CITRATE (PF) 100 MCG/2ML IJ SOLN
INTRAMUSCULAR | Status: AC
  Filled 2020-03-08: qty 2

## 2020-03-08 MED ORDER — ACETAMINOPHEN 10 MG/ML IV SOLN
INTRAVENOUS | Status: DC | PRN
  Administered 2020-03-08: 1000 mg via INTRAVENOUS

## 2020-03-08 MED ORDER — ONDANSETRON HCL 4 MG/2ML IJ SOLN
INTRAMUSCULAR | Status: AC
  Filled 2020-03-08: qty 2

## 2020-03-08 MED ORDER — LORAZEPAM 2 MG/ML IJ SOLN: 1.0000 mg | Freq: Once | INTRAMUSCULAR | Status: DC | PRN

## 2020-03-08 MED ORDER — MIDAZOLAM HCL 2 MG/2ML IJ SOLN
INTRAMUSCULAR | Status: AC
  Administered 2020-03-08: 1 mg via INTRAVENOUS
  Filled 2020-03-08: qty 2

## 2020-03-08 MED ORDER — OXYCODONE HCL 5 MG PO TABS: 5.0000 mg | ORAL_TABLET | ORAL | Status: DC | PRN

## 2020-03-08 MED ORDER — GLYCOPYRROLATE 0.2 MG/ML IJ SOLN
INTRAMUSCULAR | Status: DC | PRN
  Administered 2020-03-08: .2 mg via INTRAVENOUS

## 2020-03-08 MED ORDER — MEPERIDINE HCL 50 MG/ML IJ SOLN: 6.2500 mg | INTRAMUSCULAR | Status: DC | PRN

## 2020-03-08 MED ORDER — PROMETHAZINE HCL 25 MG/ML IJ SOLN: 6.2500 mg | INTRAMUSCULAR | Status: DC | PRN

## 2020-03-08 MED ORDER — DEXAMETHASONE SODIUM PHOSPHATE 10 MG/ML IJ SOLN
INTRAMUSCULAR | Status: AC
  Filled 2020-03-08: qty 1

## 2020-03-08 MED ORDER — POTASSIUM CHLORIDE IN NACL 20-0.9 MEQ/L-% IV SOLN
INTRAVENOUS | Status: DC
  Filled 2020-03-08 (×3): qty 1000

## 2020-03-08 MED ORDER — BUPIVACAINE-EPINEPHRINE (PF) 0.5% -1:200000 IJ SOLN
INTRAMUSCULAR | Status: AC
  Filled 2020-03-08: qty 30

## 2020-03-08 MED ORDER — OXYCODONE HCL 5 MG PO TABS: 10.0000 mg | ORAL_TABLET | ORAL | Status: DC | PRN

## 2020-03-08 MED ORDER — DROPERIDOL 2.5 MG/ML IJ SOLN
0.6250 mg | Freq: Once | INTRAMUSCULAR | Status: DC | PRN
  Filled 2020-03-08: qty 2

## 2020-03-08 MED ORDER — OXYCODONE HCL 5 MG/5ML PO SOLN: 5.0000 mg | Freq: Once | ORAL | Status: DC | PRN

## 2020-03-08 MED ORDER — LIDOCAINE HCL (PF) 1 % IJ SOLN
INTRAMUSCULAR | Status: AC
  Filled 2020-03-08: qty 5

## 2020-03-08 MED ORDER — ROPIVACAINE HCL 5 MG/ML IJ SOLN
INTRAMUSCULAR | Status: AC
  Filled 2020-03-08: qty 30

## 2020-03-08 MED ORDER — DEXAMETHASONE SODIUM PHOSPHATE 10 MG/ML IJ SOLN
INTRAMUSCULAR | Status: DC | PRN
  Administered 2020-03-08: 5 mg via INTRAVENOUS

## 2020-03-08 MED ORDER — METOCLOPRAMIDE HCL 5 MG/ML IJ SOLN: 5.0000 mg | Freq: Three times a day (TID) | INTRAMUSCULAR | Status: DC | PRN

## 2020-03-08 MED ORDER — CHLORHEXIDINE GLUCONATE 0.12 % MT SOLN
OROMUCOSAL | Status: AC
  Administered 2020-03-08: 15 mL via OROMUCOSAL
  Filled 2020-03-08: qty 15

## 2020-03-08 MED ORDER — ACETAMINOPHEN 10 MG/ML IV SOLN
INTRAVENOUS | Status: AC
  Filled 2020-03-08: qty 100

## 2020-03-08 MED ORDER — MIDAZOLAM HCL 2 MG/2ML IJ SOLN
1.0000 mg | INTRAMUSCULAR | Status: AC | PRN
  Administered 2020-03-08: 1 mg via INTRAVENOUS

## 2020-03-08 MED ORDER — GLYCOPYRROLATE 0.2 MG/ML IJ SOLN
INTRAMUSCULAR | Status: AC
  Filled 2020-03-08: qty 1

## 2020-03-08 MED ORDER — FENTANYL CITRATE (PF) 100 MCG/2ML IJ SOLN
INTRAMUSCULAR | Status: AC
  Administered 2020-03-08: 50 ug via INTRAVENOUS
  Filled 2020-03-08: qty 2

## 2020-03-08 MED ORDER — CEFAZOLIN SODIUM-DEXTROSE 2-4 GM/100ML-% IV SOLN
INTRAVENOUS | Status: AC
  Filled 2020-03-08: qty 100

## 2020-03-08 MED ORDER — HYDROMORPHONE HCL 1 MG/ML IJ SOLN
INTRAMUSCULAR | Status: AC
  Administered 2020-03-08: 0.25 mg via INTRAVENOUS
  Filled 2020-03-08: qty 1

## 2020-03-08 MED ORDER — METOCLOPRAMIDE HCL 10 MG PO TABS: 5.0000 mg | ORAL_TABLET | Freq: Three times a day (TID) | ORAL | Status: DC | PRN

## 2020-03-08 MED ORDER — HYDROMORPHONE HCL 1 MG/ML IJ SOLN: 0.2500 mg | INTRAMUSCULAR | Status: DC | PRN

## 2020-03-08 MED ORDER — ONDANSETRON HCL 4 MG PO TABS: 4.0000 mg | ORAL_TABLET | Freq: Four times a day (QID) | ORAL | Status: DC | PRN

## 2020-03-08 MED ORDER — OXYCODONE HCL 5 MG PO TABS
5.0000 mg | ORAL_TABLET | ORAL | 0 refills | Status: DC | PRN
Start: 2020-03-08 — End: 2021-06-12

## 2020-03-08 MED ORDER — ONDANSETRON HCL 4 MG/2ML IJ SOLN
4.0000 mg | Freq: Four times a day (QID) | INTRAMUSCULAR | Status: DC | PRN
  Administered 2020-03-08: 4 mg via INTRAVENOUS

## 2020-03-08 MED ORDER — MIDAZOLAM HCL 2 MG/2ML IJ SOLN
INTRAMUSCULAR | Status: AC
  Filled 2020-03-08: qty 2

## 2020-03-08 MED ORDER — FENTANYL CITRATE (PF) 100 MCG/2ML IJ SOLN
50.0000 ug | INTRAMUSCULAR | Status: AC | PRN
  Administered 2020-03-08: 50 ug via INTRAVENOUS

## 2020-03-08 SURGICAL SUPPLY — 45 items
APL PRP STRL LF DISP 70% ISPRP (MISCELLANEOUS) ×2
BNDG COHESIVE 4X5 TAN STRL (GAUZE/BANDAGES/DRESSINGS) ×2 IMPLANT
BNDG ELASTIC 4X5.8 VLCR STR LF (GAUZE/BANDAGES/DRESSINGS) ×2 IMPLANT
BNDG ESMARK 4X12 TAN STRL LF (GAUZE/BANDAGES/DRESSINGS) ×2 IMPLANT
CANISTER SUCT 1200ML W/VALVE (MISCELLANEOUS) ×2 IMPLANT
CHLORAPREP W/TINT 26 (MISCELLANEOUS) ×4 IMPLANT
COVER WAND RF STERILE (DRAPES) ×2 IMPLANT
CUFF TOURN 24 STER (MISCELLANEOUS) ×2 IMPLANT
CUFF TOURN SGL QUICK 18X4 (TOURNIQUET CUFF) IMPLANT
DRAPE FLUOR MINI C-ARM 54X84 (DRAPES) ×2 IMPLANT
DRAPE SURG 17X11 SM STRL (DRAPES) IMPLANT
DRAPE U-SHAPE 47X51 STRL (DRAPES) ×2 IMPLANT
ELECT CAUTERY BLADE 6.4 (BLADE) ×2 IMPLANT
ELECT REM PT RETURN 9FT ADLT (ELECTROSURGICAL) ×2
ELECTRODE REM PT RTRN 9FT ADLT (ELECTROSURGICAL) ×1 IMPLANT
GAUZE SPONGE 4X4 12PLY STRL (GAUZE/BANDAGES/DRESSINGS) ×2 IMPLANT
GAUZE XEROFORM 1X8 LF (GAUZE/BANDAGES/DRESSINGS) ×2 IMPLANT
GLOVE BIO SURGEON STRL SZ8 (GLOVE) ×4 IMPLANT
GLOVE INDICATOR 8.0 STRL GRN (GLOVE) ×2 IMPLANT
GOWN STRL REUS W/ TWL LRG LVL3 (GOWN DISPOSABLE) ×2 IMPLANT
GOWN STRL REUS W/TWL LRG LVL3 (GOWN DISPOSABLE) ×4
IMPL TOGGLELOC ELBOW SYSTEM (Orthopedic Implant) ×1 IMPLANT
IMPLANT TOGGLELOC ELBOW SYSTEM (Orthopedic Implant) ×2 IMPLANT
KIT TURNOVER KIT A (KITS) ×2 IMPLANT
MANIFOLD NEPTUNE II (INSTRUMENTS) ×2 IMPLANT
NS IRRIG 500ML POUR BTL (IV SOLUTION) ×2 IMPLANT
PACK EXTREMITY (MISCELLANEOUS) ×2 IMPLANT
PAD ABD DERMACEA PRESS 5X9 (GAUZE/BANDAGES/DRESSINGS) ×2 IMPLANT
PAD CAST CTTN 4X4 STRL (SOFTGOODS) ×1 IMPLANT
PADDING CAST 4IN STRL (MISCELLANEOUS) ×2
PADDING CAST BLEND 4X4 STRL (MISCELLANEOUS) ×2 IMPLANT
PADDING CAST COTTON 4X4 STRL (SOFTGOODS) ×2
SLING ARM LRG DEEP (SOFTGOODS) ×2 IMPLANT
SLING ARM XL TX990206 (SOFTGOODS) ×2 IMPLANT
SPLINT CAST 1 STEP 5X30 WHT (MISCELLANEOUS) ×2 IMPLANT
STOCKINETTE IMPERV 14X48 (MISCELLANEOUS) ×2 IMPLANT
STRIP CLOSURE SKIN 1/2X4 (GAUZE/BANDAGES/DRESSINGS) ×2 IMPLANT
SUT EXPRESS BRAID BLUE WHT (MISCELLANEOUS) ×2 IMPLANT
SUT VIC AB 2-0 CT1 27 (SUTURE) ×4
SUT VIC AB 2-0 CT1 TAPERPNT 27 (SUTURE) ×2 IMPLANT
SUT VIC AB 2-0 SH 27 (SUTURE) ×2
SUT VIC AB 2-0 SH 27XBRD (SUTURE) ×1 IMPLANT
SUT VIC AB 3-0 SH 27 (SUTURE) ×4
SUT VIC AB 3-0 SH 27X BRD (SUTURE) ×2 IMPLANT
SWABSTK COMLB BENZOIN TINCTURE (MISCELLANEOUS) ×2 IMPLANT

## 2020-03-08 NOTE — Discharge Instructions (Addendum)
Orthopedic discharge instructions: Keep splint dry and intact. Keep hand elevated above heart level. Apply ice to affected area frequently. Take ibuprofen 600-800 mg TID with meals for 7-10 days, then as necessary. Take pain medication as prescribed or ES Tylenol when needed.  Return for follow-up in 10-14 days or as scheduled.   AMBULATORY SURGERY  DISCHARGE INSTRUCTIONS   1) The drugs that you were given will stay in your system until tomorrow so for the next 24 hours you should not:  A) Drive an automobile B) Make any legal decisions C) Drink any alcoholic beverage   2) You may resume regular meals tomorrow.  Today it is better to start with liquids and gradually work up to solid foods.  You may eat anything you prefer, but it is better to start with liquids, then soup and crackers, and gradually work up to solid foods.   3) Please notify your doctor immediately if you have any unusual bleeding, trouble breathing, redness and pain at the surgery site, drainage, fever, or pain not relieved by medication.   4) Additional Instructions:

## 2020-03-08 NOTE — H&P (Signed)
History of Present Illness:  Shawn Sheppard is a 49 y.o. male that presents to clinic today for history and physical for right distal biceps tendon repair with Dr. Joice Lofts on 03/08/2020. Patient suffered immediate pain on 02/25/2020 as he was pushing a wrestling mat with his arm extended, the mat pushed back into his arm and hyperextended his elbow. He felt immediate pain burning and pop along the anterior elbow along the antecubital fossa. Since the accident, he has had persistent pain weakness and swelling. Pain is 7 out of 10. He has been wearing a sling for comfort. Symptoms are aggravated with limb range of motion.  The patient also complaining of left lateral elbow pain. He has pain with grasping and gripping. Pain is been exacerbated with limited use of his right upper extremity. No numbness or tingling in left upper extremity. No weakness or limitations in range of motion.  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  Medications, Past Medical/Surgical/Family/Social History:   Current Outpatient Medications: . diazePAM (VALIUM) 10 MG tablet  . ibuprofen (MOTRIN) 800 MG tablet Take 1 tablet (800 mg total) by mouth every 8 (eight) hours as needed for Pain 90 tablet 1  . levothyroxine (SYNTHROID) 50 MCG tablet TAKE 1 TABLET(50 MCG) BY MOUTH EVERY DAY 90 tablet 1  . sertraline (ZOLOFT) 100 MG tablet Take 1 tablet (100 mg total) by mouth once daily Take one tab by mouth once daily 30 tablet 3  . sildenafiL (VIAGRA) 25 MG tablet Take 1 tab PO every day AS NEEDED for erectile dysfunction 10 tablet 0  . zaleplon (SONATA) 10 MG capsule   Past Medical History: Obesity, hypothyroid, cervical radiculitis, anxiety, GERD, hyperlipidemia  No relevant past Surgical History:  Relevant Orthopedic Family History: None  Review of Systems:  A 10+ ROS was  performed, reviewed, and the pertinent orthopaedic findings are documented in the HPI.   Physical Examination:  BP 136/84  Ht 177.8 cm (5\' 10" )  Wt (!) 111.9 kg (246 lb 9.6 oz)  BMI 35.38 kg/m  General:  Well developed, well nourished, no apparent distress, normal affect, normal gait with no antalgic component.   HEENT: Head normocephalic, atraumatic, PERRL.   Abdomen: Soft, non tender, non distended, Bowel sounds present.  Heart: Examination of the heart reveals regular, rate, and rhythm. There is no murmur noted on ascultation. There is a normal apical pulse.  Lungs: Lungs are clear to auscultation. There is no wheeze, rhonchi, or crackles. There is normal expansion of bilateral chest walls.   Comprehensive Elbow Exam: Inspection Right Left  Alignment Neutral Neutral  Skin + reverse popeye deformity.  No ecchymosis or erythema. Normal appearance with no obvious deformity. No ecchymosis or erythema.  Soft Tissue + anterior elbow focal soft tissue swelling No focal soft tissue swelling   Palpation  Right Left  Tenderness + biceps, antecubital fossa, medial/lateral epicondyle pain tender along the left lateral epicondyle with painful resisted wrist extension  Effusion No joint effusion No joint effusion   Range of Motion Right Left  Flexion Limited AROM to 110 degrees with pain Normal AROM 150 degrees  Extension Lacking 90 degrees extension due to pain full extension, full 5 out of 5 strength with resisted elbow extension. No palpable defect in the distal tricep  Pronation Full to 90 degrees Full to 90 degrees  Supination  Full to 90 degrees Full to 90 degrees  Wrist Flexion Full to 70 degrees Full to 70 degrees  Wrist Extension Full to 70 degrees Full to 70 degrees   Strength Right Left  Flexion GWW 5/5  Extension 5/5 5/5  Pronation 5/5 5/5  Supination GWW 5/5  Wrist Flexion 5/5 5/5  Wrist Extension 5/5 5/5   Special Tests Right Left  Varus Instability Unable  to assess due to pain Negative  Valgus Instability Unable to assess due to pain Negative  Hook Test Positive (No palpable tendon) Negative (palpable tendon)   Neurovascular Right Left  Distal Motor Normal Normal  Distal Sensory Normal light touch sensation Normal light touch sensation  Distal Pulses Normal Normal   MRI OF THE RIGHT ELBOW:  Complete tear of the biceps from the bicipital tuberosity of the  radius with retraction to the level of the articulation of the  humerus and ulna, 5-6 cm. The exam is otherwise negative.   Assessment:   Rupture of right biceps tendon.   Plan:  The treatment options, including both surgical and nonsurgical choices, have been discussed in detail with the patient. The patient would like to proceed with surgical intervention to include a primary repair of the right distal biceps tendon rupture. The risks (including bleeding, infection, nerve and/or blood vessel injury, persistent or recurrent pain, loosening or failure of the components, leg length inequality, dislocation, need for further surgery, blood clots, strokes, heart attacks or arrhythmias, pneumonia, etc.) and benefits of the surgical procedure were discussed. The patient states his/her understanding and agrees to proceed. @CAPHE @ agrees to a blood transfusion if necessary. A formal written consent will be obtained by the nursing staff.   H&P reviewed and patient re-examined. No changes.

## 2020-03-08 NOTE — Progress Notes (Signed)
  [facility] Perioperative Services: Pre-Admission/Anesthesia Testing     Date: 03/08/20  Name: Shawn Sheppard MRN:   [id number]  Re: Change in ABX for upcoming surgery   Case: [id number] Date/Time: 03/08/20 1313   Procedure: DISTAL BICEPS TENDON REPAIR (Right Elbow)   Anesthesia type: General, Regional   Pre-op diagnosis: Rupture of right biceps tendon, initial encounter S46.211A   Location: [facility] OR ROOM 03 / [facility] ORS FOR ANESTHESIA GROUP   Surgeons: Christena Flake, MD    Primary attending surgeon was consulted regarding consideration of therapeutic change in antimicrobial agent being used for preoperative prophylaxis in this patient's upcoming surgical case. Following analysis of the risk versus benefits, Dr. Joice Lofts, Excell Seltzer, MD advising that it would be acceptable to discontinue the ordered clindamycin and place an order for cefazolin 2 gm IV on call to the OR. Orders for this patient were amended by me following collaborative conversation with attending surgeon.  [name], MSN, APRN, FNP-C, CEN [facility]  Peri-operative Services Nurse Practitioner Phone: [phone] 03/08/20 12:21 PM

## 2020-03-08 NOTE — Anesthesia Procedure Notes (Signed)
Procedure Name: LMA Insertion Date/Time: 03/08/2020 1:28 PM Performed by: Henrietta Hoover, CRNA Pre-anesthesia Checklist: Patient identified, Emergency Drugs available, Suction available and Patient being monitored Patient Re-evaluated:Patient Re-evaluated prior to induction Oxygen Delivery Method: Circle system utilized Preoxygenation: Pre-oxygenation with 100% oxygen Induction Type: IV induction Ventilation: Mask ventilation without difficulty LMA: LMA inserted LMA Size: 5.0 Number of attempts: 1 Placement Confirmation: positive ETCO2 and breath sounds checked- equal and bilateral Tube secured with: Tape Dental Injury: Teeth and Oropharynx as per pre-operative assessment

## 2020-03-08 NOTE — Anesthesia Procedure Notes (Addendum)
Anesthesia Regional Block: Supraclavicular block   Pre-Anesthetic Checklist: ,, timeout performed, Correct Patient, Correct Site, Correct Laterality, Correct Procedure, Correct Position, site marked, Risks and benefits discussed,  Surgical consent,  Pre-op evaluation,  At surgeon's request and post-op pain management  Laterality: Right  Prep: Maximum Sterile Barrier Precautions used, chloraprep       Needles:  Injection technique: Single-shot  Needle Type: Stimiplex     Needle Length: 4cm  Needle Gauge: 20     Additional Needles:   Procedures:,,,, ultrasound used (permanent image in chart),,,,  Narrative:  Start time: 03/08/2020 12:34 PM End time: 03/08/2020 12:36 PM Injection made incrementally with aspirations every 5 mL.  Performed by: Personally  Anesthesiologist: Emilio Math, DO  Additional Notes: Ropivacaine 0.5% 88ml with Decadron 5mg 

## 2020-03-08 NOTE — Transfer of Care (Signed)
Immediate Anesthesia Transfer of Care Note  Patient: Shawn Sheppard  Procedure(s) Performed: DISTAL BICEPS TENDON REPAIR (Right Elbow)  Patient Location: PACU  Anesthesia Type:GA combined with regional for post-op pain  Level of Consciousness: awake, drowsy and patient cooperative  Airway & Oxygen Therapy: Patient Spontanous Breathing and Patient connected to face mask oxygen  Post-op Assessment: Report given to RN and Post -op Vital signs reviewed and stable  Post vital signs: Reviewed and stable  Last Vitals:  Vitals Value Taken Time  BP 152/90 03/08/20 1456  Temp 36.2 C 03/08/20 1456  Pulse 66 03/08/20 1507  Resp 10 03/08/20 1507  SpO2 97 % 03/08/20 1507  Vitals shown include unvalidated device data.  Last Pain:  Vitals:   03/08/20 1504  TempSrc:   PainSc: 5          Complications: No complications documented.

## 2020-03-08 NOTE — Anesthesia Preprocedure Evaluation (Signed)
Anesthesia Evaluation  Patient identified by MRN, date of birth, ID band Patient awake    Reviewed: Allergy & Precautions, NPO status , Patient's Chart, lab work & pertinent test results  Airway Mallampati: II       Dental  (+) Teeth Intact   Pulmonary neg pulmonary ROS,    Pulmonary exam normal breath sounds clear to auscultation       Cardiovascular negative cardio ROS   Rhythm:Regular Rate:Normal     Neuro/Psych PSYCHIATRIC DISORDERS Anxiety negative neurological ROS     GI/Hepatic negative GI ROS, Neg liver ROS,   Endo/Other  Hypothyroidism   Renal/GU negative Renal ROS  negative genitourinary   Musculoskeletal negative musculoskeletal ROS (+)   Abdominal   Peds negative pediatric ROS (+)  Hematology negative hematology ROS (+)   Anesthesia Other Findings   Reproductive/Obstetrics negative OB ROS                             Anesthesia Physical Anesthesia Plan  ASA: II  Anesthesia Plan: General and Regional   Post-op Pain Management: GA combined w/ Regional for post-op pain   Induction: Intravenous  PONV Risk Score and Plan: 2 and Ondansetron and Dexamethasone  Airway Management Planned: LMA  Additional Equipment:   Intra-op Plan:   Post-operative Plan: Extubation in OR  Informed Consent: I have reviewed the patients History and Physical, chart, labs and discussed the procedure including the risks, benefits and alternatives for the proposed anesthesia with the patient or authorized representative who has indicated his/her understanding and acceptance.     Dental advisory given  Plan Discussed with: CRNA, Anesthesiologist and Surgeon  Anesthesia Plan Comments:         Anesthesia Quick Evaluation

## 2020-03-08 NOTE — Op Note (Signed)
03/08/2020  2:55 PM  Patient:   Shawn Sheppard  Pre-Op Diagnosis:   Distal biceps tendon rupture, right elbow.  Post-Op Diagnosis:   Same.  Procedure:   Primary repair of ruptured distal biceps tendon, right elbow.  Surgeon:   Maryagnes Amos, MD  Assistant:   None  Anesthesia:   General LMA with interscalene block placed by anesthesiologist  Findings:   As above.  Complications:   None  EBL:   5 cc  Fluids:   800 cc crystalloid  TT:   59 minutes at 275 mmHg  Drains:   None  Closure:   3-0 Vicryl subcuticular sutures  Implants:   Biomet ToggleLoc 1  Brief Clinical Note:   The patient is a 49 year old male who sustained the above-noted injury last week while moving a large wrestling mat. Subsequent work-up confirmed the presence of a ruptured right distal biceps tendon. The patient presents at this time for definitive management of this injury.  Procedure:   The patient underwent placement of an interscalene block in the preoperative holding area before he was brought into the operating room and lain in the supine position. After adequate general laryngeal mask anesthesia was achieved, the right upper extremity and hand were prepped with ChloraPrep solution before being draped sterilely. Preoperative antibiotics were administered.  A timeout was performed to verify the appropriate surgical site before the limb was exsanguinated with an Esmarch and the tourniquet inflated to 250 mmHg.   A 4-5 centimeter longitudinal incision was extended distally from the elbow flexion crease. The incision was carried down through the subcutaneous cutaneous tissues with care taken to identify and protect the neurovascular structures in the area. Using blunt dissection, the distal biceps tendon was identified. The lacertus fibrosis was still intact, preventing proximal migration of the tendon, facilitating tendon identification. The distal portion of the tendon was debrided of degenerative tissues  before a #2 OrthoSorb suture was woven through the tendon. The loop of the Biomet ToggleLoc device was then secured to the distal end of the tendon by tying the ends of this #2 suture tightly.   Attention was directed distally. The bicipital sheath was followed down to the proximal radius using blunt dissection. With care, the bicipital tuberosity was identified by palpation and then dissected free of adjacent soft tissue. While holding the forearm in maximal supination, a guidewire was placed and its position verified using FluoroScan imaging in AP and lateral projections. The guidewire was overreamed with an 8 mm acorn reamer through the anterior cortex before the posterior cortex was overreamed with a 4.5 mm reamer. The Beath needle guidewire was advanced through the dorsal forearm and used to advance the toggle lock anchor. The ToggleLoc anchor was pulled through the holes drilled into the bicipital tuberosity before it was deployed. The adequacy of fixation of the toggle lock anchor was verified by pulling strongly on the sutures. The tendon was cinched into the bony socket with the elbow flexed to 90. The adequacy of anchor position was verified fluoroscopically in AP and lateral projections and found to be excellent. The ToggleLoc sutures were then cut and removed. The elbow was ranged passively and the repair was deemed to be stable to within 20 of extension.  The wound was copiously irrigated with sterile saline solution before the subcutaneous tissues were closed with 2-0 Vicryl interrupted sutures. 3-0 Vicryl subcuticular sutures were used to close the skin before Benzoin and steri-strips were applied to the skin. A total of 10  cc of 0.5% plain Sensorcaine was injected in and around the incision site. A sterile bulky dressing was applied to the elbow before the arm was placed into a posterior splint maintaining the elbow at approximately 90 of flexion and in neutral rotation. The patient was then  awakened, extubated, and returned to the recovery room in satisfactory condition after tolerating the procedure well.

## 2020-03-09 ENCOUNTER — Encounter: Payer: Self-pay | Admitting: Surgery

## 2020-03-09 NOTE — Anesthesia Postprocedure Evaluation (Signed)
Anesthesia Post Note  Patient: Shawn Sheppard  Procedure(s) Performed: DISTAL BICEPS TENDON REPAIR (Right Elbow)  Patient location during evaluation: PACU Anesthesia Type: Regional and General Level of consciousness: awake Pain management: pain level controlled Vital Signs Assessment: post-procedure vital signs reviewed and stable Respiratory status: spontaneous breathing Cardiovascular status: blood pressure returned to baseline and stable Postop Assessment: no apparent nausea or vomiting Anesthetic complications: no   No complications documented.   Last Vitals:  Vitals:   03/08/20 1539 03/08/20 1619  BP: (!) (P) 147/80 134/83  Pulse: (P) 72 66  Resp: (P) 16 16  Temp: (!) (P) 36.3 C   SpO2: (P) 97% 96%    Last Pain:  Vitals:   03/08/20 1619  TempSrc:   PainSc: 2                  Emilio Math

## 2020-05-22 ENCOUNTER — Other Ambulatory Visit: Payer: Self-pay | Admitting: Student

## 2020-05-22 DIAGNOSIS — M79631 Pain in right forearm: Secondary | ICD-10-CM

## 2020-05-22 DIAGNOSIS — M25511 Pain in right shoulder: Secondary | ICD-10-CM

## 2020-05-22 DIAGNOSIS — S63501A Unspecified sprain of right wrist, initial encounter: Secondary | ICD-10-CM

## 2020-05-22 DIAGNOSIS — G8929 Other chronic pain: Secondary | ICD-10-CM

## 2020-05-29 ENCOUNTER — Other Ambulatory Visit: Payer: Self-pay

## 2020-05-29 ENCOUNTER — Ambulatory Visit
Admission: RE | Admit: 2020-05-29 | Discharge: 2020-05-29 | Disposition: A | Discharge status: Home / Self Care | Payer: BC Managed Care – PPO | Source: Ambulatory Visit | Attending: Student | Admitting: Student

## 2020-05-29 DIAGNOSIS — S63501A Unspecified sprain of right wrist, initial encounter: Secondary | ICD-10-CM | POA: Diagnosis present

## 2020-05-29 DIAGNOSIS — G8929 Other chronic pain: Secondary | ICD-10-CM | POA: Insufficient documentation

## 2020-05-29 DIAGNOSIS — M25511 Pain in right shoulder: Secondary | ICD-10-CM | POA: Diagnosis not present

## 2020-05-30 ENCOUNTER — Ambulatory Visit
Admission: RE | Admit: 2020-05-30 | Discharge: 2020-05-30 | Disposition: A | Discharge status: Home / Self Care | Payer: BC Managed Care – PPO | Source: Ambulatory Visit | Attending: Student | Admitting: Student

## 2020-05-30 DIAGNOSIS — S63501A Unspecified sprain of right wrist, initial encounter: Secondary | ICD-10-CM | POA: Insufficient documentation

## 2020-05-30 DIAGNOSIS — M79631 Pain in right forearm: Secondary | ICD-10-CM | POA: Diagnosis present

## 2020-07-07 ENCOUNTER — Other Ambulatory Visit: Payer: Self-pay

## 2020-07-07 ENCOUNTER — Emergency Department
Admission: EM | Admit: 2020-07-07 | Discharge: 2020-07-07 | Disposition: A | Discharge status: Home / Self Care | Payer: BC Managed Care – PPO | Attending: Emergency Medicine | Admitting: Emergency Medicine

## 2020-07-07 ENCOUNTER — Emergency Department: Payer: BC Managed Care – PPO

## 2020-07-07 ENCOUNTER — Encounter: Payer: Self-pay | Admitting: Emergency Medicine

## 2020-07-07 DIAGNOSIS — E039 Hypothyroidism, unspecified: Secondary | ICD-10-CM | POA: Insufficient documentation

## 2020-07-07 DIAGNOSIS — Z79899 Other long term (current) drug therapy: Secondary | ICD-10-CM | POA: Diagnosis not present

## 2020-07-07 DIAGNOSIS — N390 Urinary tract infection, site not specified: Secondary | ICD-10-CM | POA: Diagnosis not present

## 2020-07-07 DIAGNOSIS — N200 Calculus of kidney: Secondary | ICD-10-CM | POA: Insufficient documentation

## 2020-07-07 DIAGNOSIS — R109 Unspecified abdominal pain: Secondary | ICD-10-CM

## 2020-07-07 LAB — CBC
HCT: 46.9 % (ref 39.0–52.0)
Hemoglobin: 16.2 g/dL (ref 13.0–17.0)
MCH: 30.1 pg (ref 26.0–34.0)
MCHC: 34.5 g/dL (ref 30.0–36.0)
MCV: 87 fL (ref 80.0–100.0)
Platelets: 142 K/uL — ABNORMAL LOW (ref 150–400)
RBC: 5.39 MIL/uL (ref 4.22–5.81)
RDW: 12.2 % (ref 11.5–15.5)
WBC: 5.7 K/uL (ref 4.0–10.5)
nRBC: 0 % (ref 0.0–0.2)

## 2020-07-07 LAB — HEPATIC FUNCTION PANEL
ALT: 72 U/L — ABNORMAL HIGH (ref 0–44)
AST: 46 U/L — ABNORMAL HIGH (ref 15–41)
Albumin: 4.6 g/dL (ref 3.5–5.0)
Alkaline Phosphatase: 37 U/L — ABNORMAL LOW (ref 38–126)
Bilirubin, Direct: 0.2 mg/dL (ref 0.0–0.2)
Indirect Bilirubin: 1.1 mg/dL — ABNORMAL HIGH (ref 0.3–0.9)
Total Bilirubin: 1.3 mg/dL — ABNORMAL HIGH (ref 0.3–1.2)
Total Protein: 7.3 g/dL (ref 6.5–8.1)

## 2020-07-07 LAB — URINALYSIS, ROUTINE W REFLEX MICROSCOPIC
Bilirubin Urine: NEGATIVE
Glucose, UA: NEGATIVE mg/dL
Ketones, ur: NEGATIVE mg/dL
Leukocytes,Ua: NEGATIVE
Nitrite: NEGATIVE
Protein, ur: 100 mg/dL — AB
RBC / HPF: >50 RBC/hpf — ABNORMAL HIGH (ref 0–5)
Specific Gravity, Urine: 1.017 (ref 1.005–1.030)
pH: 5 (ref 5.0–8.0)

## 2020-07-07 LAB — BASIC METABOLIC PANEL WITH GFR
Anion gap: 8 (ref 5–15)
BUN: 19 mg/dL (ref 6–20)
CO2: 27 mmol/L (ref 22–32)
Calcium: 9.2 mg/dL (ref 8.9–10.3)
Chloride: 102 mmol/L (ref 98–111)
Creatinine, Ser: 1.03 mg/dL (ref 0.61–1.24)
GFR, Estimated: >60 mL/min
Glucose, Bld: 122 mg/dL — ABNORMAL HIGH (ref 70–99)
Potassium: 4.1 mmol/L (ref 3.5–5.1)
Sodium: 137 mmol/L (ref 135–145)

## 2020-07-07 LAB — CK: Total CK: 92 U/L (ref 49–397)

## 2020-07-07 MED ORDER — CEPHALEXIN 500 MG PO CAPS: 500.0000 mg | ORAL_CAPSULE | Freq: Three times a day (TID) | ORAL | 0 refills | Status: DC

## 2020-07-07 MED ORDER — ONDANSETRON 4 MG PO TBDP: 4.0000 mg | ORAL_TABLET | Freq: Three times a day (TID) | ORAL | 0 refills | Status: DC | PRN

## 2020-07-07 MED ORDER — KETOROLAC TROMETHAMINE 10 MG PO TABS: 10.0000 mg | ORAL_TABLET | Freq: Four times a day (QID) | ORAL | 0 refills | Status: DC | PRN

## 2020-07-07 MED ORDER — ONDANSETRON HCL 4 MG/2ML IJ SOLN
4.0000 mg | Freq: Once | INTRAMUSCULAR | Status: AC
  Administered 2020-07-07: 4 mg via INTRAVENOUS
  Filled 2020-07-07: qty 2

## 2020-07-07 MED ORDER — SODIUM CHLORIDE 0.9 % IV SOLN
1.0000 g | Freq: Once | INTRAVENOUS | Status: AC
  Administered 2020-07-07: 1 g via INTRAVENOUS
  Filled 2020-07-07: qty 10

## 2020-07-07 MED ORDER — KETOROLAC TROMETHAMINE 30 MG/ML IJ SOLN
30.0000 mg | Freq: Once | INTRAMUSCULAR | Status: AC
  Administered 2020-07-07: 30 mg via INTRAVENOUS
  Filled 2020-07-07: qty 1

## 2020-07-07 MED ORDER — ONDANSETRON 4 MG PO TBDP: 4.0000 mg | ORAL_TABLET | Freq: Once | ORAL | Status: DC

## 2020-07-07 MED ORDER — SODIUM CHLORIDE 0.9 % IV BOLUS
1000.0000 mL | Freq: Once | INTRAVENOUS | Status: AC
  Administered 2020-07-07: 1000 mL via INTRAVENOUS

## 2020-07-07 MED ORDER — TAMSULOSIN HCL 0.4 MG PO CAPS: 0.4000 mg | ORAL_CAPSULE | Freq: Every day | ORAL | 0 refills | Status: DC

## 2020-07-07 NOTE — ED Provider Notes (Signed)
[facility] Emergency Department Provider Note  Time seen: 1:11 PM  I have reviewed the triage vital signs and the nursing notes.   HISTORY  Chief Complaint Flank Pain   HPI Shawn Sheppard is a 50 y.o. male with a past medical history of anxiety, hypothyroidism, presents emergency department for left flank pain.   According to the patient since yesterday he has been experiencing left lower quadrant/left flank pain with occasional dysuria which he describes as a burning sensation.  States he is also noticed his urine is looked very dark recently as well.  Patient states the dark urine has been an intermittent issue over the last several weeks.  States nausea and vomiting today due to the pain.  Denies any fever.  No history of kidney stones.  Past Medical History:  Diagnosis Date  . Anxiety   . Hypothyroidism   . Thyroid disease     There are no problems to display for this patient.   Past Surgical History:  Procedure Laterality Date  . DISTAL BICEPS TENDON REPAIR Right 03/08/2020   Procedure: DISTAL BICEPS TENDON REPAIR;  Surgeon: Christena Flake, MD;  Location: [facility] ORS;  Service: Orthopedics;  Laterality: Right;  . TESTICLE TORSION REDUCTION Left 2006  . VASECTOMY      Prior to Admission medications   Medication Sig Start Date End Date Taking? Authorizing Provider  ibuprofen (ADVIL) 600 MG tablet Take 1 tablet (600 mg total) by mouth every 6 (six) hours as needed. 02/18/20   Domenick Gong, MD  levothyroxine (SYNTHROID, LEVOTHROID) 50 MCG tablet Take 50 mcg by mouth daily before breakfast.    [provider]  oxyCODONE (ROXICODONE) 5 MG immediate release tablet Take 1-2 tablets (5-10 mg total) by mouth every 4 (four) hours as needed for moderate pain or severe pain. 03/08/20   Poggi, Excell Seltzer, MD  ranitidine (ZANTAC) 150 MG tablet Take 150 mg by mouth 2 (two) times daily.     [provider]  sertraline (ZOLOFT) 100 MG tablet Take 100 mg by  mouth daily. 01/31/20   [provider]  zaleplon (SONATA) 10 MG capsule Take 10 mg by mouth at bedtime as needed for sleep.    [provider]  colchicine 0.6 MG tablet 2 tabs po x 1, then one tab po 1 hour later 02/18/20 02/25/20  Domenick Gong, MD  loratadine (CLARITIN) 10 MG tablet Take 10 mg by mouth daily.  02/18/20  [provider]    Allergies  Allergen Reactions  . Augmentin [Amoxicillin-Pot Clavulanate]   . Sulfa Antibiotics   . Penicillins Rash    History reviewed. No pertinent family history.  Social History Social History   Tobacco Use  . Smoking status: Never Smoker  . Smokeless tobacco: Never Used  Vaping Use  . Vaping Use: Never used  Substance Use Topics  . Alcohol use: No  . Drug use: No    Review of Systems Constitutional: Negative for fever. Cardiovascular: Negative for chest pain. Respiratory: Negative for shortness of breath. Gastrointestinal: Moderate sharp left flank/left lower quadrant pain, currently resolved.  Positive for nausea vomiting, none currently. Genitourinary: Dark urine intermittent times last 2 weeks. Musculoskeletal: Negative for musculoskeletal complaints Neurological: Negative for headache All other ROS negative  ____________________________________________   PHYSICAL EXAM:  VITAL SIGNS: ED Triage Vitals  Enc Vitals Group     BP 07/07/20 1158 134/89     Pulse Rate 07/07/20 1158 62     Resp 07/07/20 1158  18     Temp 07/07/20 1255 97.6 F (36.4 C)     Temp Source 07/07/20 1255 Oral     SpO2 07/07/20 1158 100 %     Weight 07/07/20 1204 248 lb (112.5 kg)     Height 07/07/20 1204 5\' 10"  (1.778 m)     Head Circumference --      Peak Flow --      Pain Score 07/07/20 1204 2     Pain Loc --      Pain Edu? --      Excl. in GC? --    Constitutional: Alert and oriented. Well appearing and in no distress. Eyes: Normal exam ENT      Head: Normocephalic and atraumatic.      Mouth/Throat: Mucous  membranes are moist. Cardiovascular: Normal rate, regular rhythm.  Respiratory: Normal respiratory effort without tachypnea nor retractions. Breath sounds are clear  Gastrointestinal: Soft and nontender. No distention. Musculoskeletal: Nontender with normal range of motion in all extremities.  Neurologic:  Normal speech and language. No gross focal neurologic deficits Skin:  Skin is warm, dry and intact.  Psychiatric: Mood and affect are normal  ____________________________________________   RADIOLOGY  CT shows a punctate 2 mm stone at the left UVJ.  ____________________________________________   INITIAL IMPRESSION / ASSESSMENT AND PLAN / ED COURSE  Pertinent labs & imaging results that were available during my care of the patient were reviewed by me and considered in my medical decision making (see chart for details).   Patient presents emergency department for left flank pain along with intermittent dark urine.  Symptoms are very suggestive of ureterolithiasis.  Does state slight burning with urination we will check a urinalysis as well as urine culture.  We will proceed with CT renal scan to evaluate for possible kidney stone.  We will also check labs including a CK and hepatic function panel to rule out bilirubin or rhabdomyolysis causing dark urine.  We will treat with Toradol, IV fluids and continue to closely monitor.  Patient agreeable to plan of care.  Patient's urinalysis has resulted showing greater than 50 RBCs but also 21-50 white blood cells and many bacteria consistent with a urinary tract infection.  We will send a urine culture we will start the patient on IV Rocephin.  CT renal scan is pending to evaluate for possible ureteral stone.  Patient's work-up shows a 2 mm left UVJ stone.  Patient's urinalysis does show signs of urinary tract infection.  Urine culture has been sent.  We will dose IV Rocephin in the emergency department.  I spoke to Dr. 09/06/20 of urology as the  patient has no white blood cell count elevation no fever he believes the patient still safe for discharge home.  We will start the patient on antibiotics and have the patient follow-up with Dr. Lonna Cobb next week.  Patient agreeable to plan of care.  Shawn Sheppard was evaluated in Emergency Department on 07/07/2020 for the symptoms described in the history of present illness. He was evaluated in the context of the global COVID-19 pandemic, which necessitated consideration that the patient might be at risk for infection with the SARS-CoV-2 virus that causes COVID-19. Institutional protocols and algorithms that pertain to the evaluation of patients at risk for COVID-19 are in a state of rapid change based on information released by regulatory bodies including the CDC and federal and state organizations. These policies and algorithms were followed during the patient's care in the ED.  ____________________________________________   FINAL CLINICAL IMPRESSION(S) / ED DIAGNOSES  Left flank pain Kidney stone Urinary tract infection   Minna Antis, MD 07/07/20 1513

## 2020-07-07 NOTE — ED Triage Notes (Signed)
Pt to ER with c/o left flank pain that radiates into left groin and is accompanied by nausea and dry heaves.  Pt also c/o burning sensation after urination.

## 2020-07-07 NOTE — ED Notes (Signed)
Pt presented to ED with left flank pain that radiates to LLQ and it started last night. Pt states burning sensation when urinating and urine has been "mud brown" that started Monday 03/28. Pt complains of dry heaving and nausea. Pt states a family history of kidney stones. Denies fevers. Pt is A&Ox4 and NAD.

## 2020-07-07 NOTE — Discharge Instructions (Addendum)
Please take antibiotics for their entire course.  Please take your pain and nausea medication as needed, as written.  Please call the number to follow-up with Dr. Lonna Cobb this coming week.  Return to the emergency department immediately if you develop a fever worsening abdominal pain, unable to keep down your antibiotics due to nausea vomiting, or any other symptom personally concerning to yourself.  Please drink plenty of water.

## 2020-07-07 NOTE — ED Notes (Signed)
Pt taken to CT at this time.

## 2020-07-09 ENCOUNTER — Telehealth: Payer: Self-pay | Admitting: *Deleted

## 2020-07-09 DIAGNOSIS — N200 Calculus of kidney: Secondary | ICD-10-CM

## 2020-07-09 LAB — URINE CULTURE: Culture: NO GROWTH

## 2020-07-09 NOTE — Telephone Encounter (Signed)
-----   Message from Riki Altes, MD sent at 07/08/2020  9:25 PM EDT ----- Regarding: Appointment Seen in ED over the weekend with a stone.  Please schedule follow-up appointment with me this week with KUB prior

## 2020-07-09 NOTE — Telephone Encounter (Signed)
Left message for patient to be scheduled. Order in for kub

## 2021-06-07 ENCOUNTER — Encounter: Payer: Self-pay | Admitting: Urology

## 2021-06-07 ENCOUNTER — Ambulatory Visit: Payer: Self-pay | Admitting: Urology

## 2021-06-12 ENCOUNTER — Other Ambulatory Visit: Payer: Self-pay

## 2021-06-12 ENCOUNTER — Ambulatory Visit: Payer: BC Managed Care – PPO | Admitting: Urology

## 2021-06-12 ENCOUNTER — Encounter: Payer: Self-pay | Admitting: Urology

## 2021-06-12 VITALS — BP 120/76 | HR 64 | Ht 70.0 in | Wt 243.0 lb

## 2021-06-12 DIAGNOSIS — N200 Calculus of kidney: Secondary | ICD-10-CM

## 2021-06-12 DIAGNOSIS — R310 Gross hematuria: Secondary | ICD-10-CM

## 2021-06-12 LAB — MICROSCOPIC EXAMINATION
Bacteria, UA: NONE SEEN
RBC, Urine: >30 /hpf — AB (ref 0–2)

## 2021-06-12 LAB — URINALYSIS, COMPLETE
Bilirubin, UA: NEGATIVE
Glucose, UA: NEGATIVE
Ketones, UA: NEGATIVE
Nitrite, UA: NEGATIVE
Specific Gravity, UA: 1.02 (ref 1.005–1.030)
Urobilinogen, Ur: 1 mg/dL (ref 0.2–1.0)
pH, UA: 7 (ref 5.0–7.5)

## 2021-06-12 NOTE — H&P (View-Only) (Signed)
? ?06/12/2021 ?1:08 PM  ? ?Shawn Montesaul H Mckinnie ?08/08/1970 ?[id number] ? ?Referring provider: Orene DesanctisBehling, Karen, MD ?[address] ?[place],  [place] [id number] ? ?Chief Complaint  ?Patient presents with  ? Nephrolithiasis  ? ? ?HPI: ?Shawn Sheppard is a 51 y.o. who presents for evaluation of hematuria. ? ?Complaining of intermittent gross hematuria which started in early 2022 ?States at least every other week he will have episodes of gross hematuria ?Sometimes associated with frequency and mild penile pain however other times are asymptomatic ?Last episode was yesterday and he brought in a video of his void-the urine was coffee colored initially then followed by slightly burgundy colored urine.  No clots noted ?[facility] 07/07/2020 with left lower quadrant/left flank pain.  Stone protocol CT showed a 2 mm left distal ureteral calculus and a 4 mm nonobstructing left lower pole calculus ?Prior history of torsion status post detorsion/bilateral orchiopexy 2006 ? ?PMH: ?Past Medical History:  ?Diagnosis Date  ? Anxiety   ? Hypothyroidism   ? Thyroid disease   ? ? ?Surgical History: ?Past Surgical History:  ?Procedure Laterality Date  ? DISTAL BICEPS TENDON REPAIR Right 03/08/2020  ? Procedure: DISTAL BICEPS TENDON REPAIR;  Surgeon: Christena FlakePoggi, John J, MD;  Location: [facility] ORS;  Service: Orthopedics;  Laterality: Right;  ? TESTICLE TORSION REDUCTION Left 2006  ? VASECTOMY    ? ? ?Home Medications:  ?Allergies as of 06/12/2021   ? ?   Reactions  ? Augmentin [amoxicillin-pot Clavulanate]   ? Sulfa Antibiotics   ? Penicillins Rash  ? Tramadol Rash  ? ?  ? ?  ?Medication List  ?  ? ?  ? Accurate as of June 12, 2021  1:08 PM. If you have any questions, ask your nurse or doctor.  ?  ?  ? ?  ? ?STOP taking these medications   ? ?cephALEXin 500 MG capsule ?Commonly known as: KEFLEX ?Stopped by: Riki AltesScott C Alexandrina Fiorini, MD ?  ?ketorolac 10 MG tablet ?Commonly known as: TORADOL ?Stopped by: Riki AltesScott C Quetzal Meany, MD ?  ?ondansetron 4 MG disintegrating  tablet ?Commonly known as: Zofran ODT ?Stopped by: Riki AltesScott C Rayetta Veith, MD ?  ?oxyCODONE 5 MG immediate release tablet ?Commonly known as: Roxicodone ?Stopped by: Riki AltesScott C Mical Kicklighter, MD ?  ?tamsulosin 0.4 MG Caps capsule ?Commonly known as: FLOMAX ?Stopped by: Riki AltesScott C Toivo Bordon, MD ?  ? ?  ? ?TAKE these medications   ? ?ibuprofen 600 MG tablet ?Commonly known as: ADVIL ?Take 1 tablet (600 mg total) by mouth every 6 (six) hours as needed. ?  ?levothyroxine 50 MCG tablet ?Commonly known as: SYNTHROID ?Take 50 mcg by mouth daily before breakfast. ?  ?ranitidine 150 MG tablet ?Commonly known as: ZANTAC ?Take 150 mg by mouth 2 (two) times daily. ?  ?sertraline 100 MG tablet ?Commonly known as: ZOLOFT ?Take 100 mg by mouth daily. ?  ?zaleplon 10 MG capsule ?Commonly known as: SONATA ?Take 10 mg by mouth at bedtime as needed for sleep. ?  ? ?  ? ? ?Allergies:  ?Allergies  ?Allergen Reactions  ? Augmentin [Amoxicillin-Pot Clavulanate]   ? Sulfa Antibiotics   ? Penicillins Rash  ? Tramadol Rash  ? ? ?Family History: ?No family history on file. ? ?Social History:  reports that he has never smoked. He has never used smokeless tobacco. He reports that he does not drink alcohol and does not use drugs. ? ? ?Physical Exam: ?BP 120/76   Pulse 64   Ht 5\' 10"  (1.778  m)   Wt 243 lb (110.2 kg)   BMI 34.87 kg/m?   ?Constitutional:  Alert and oriented, No acute distress. ?HEENT: [place] AT, moist mucus membranes.  Trachea midline, no masses. ?Cardiovascular: No clubbing, cyanosis, or edema. ?Respiratory: Normal respiratory effort, no increased work of breathing. ?Neurologic: Grossly intact, no focal deficits, moving all 4 extremities. ?Psychiatric: Normal mood and affect. ? ?Laboratory Data: ? ?Urinalysis ?Microscopy 21-50 RBC/6-10 WBC ? ?Pertinent Imaging: ?CT images were personally reviewed and interpreted ? ?CT Renal Stone Study ? ?Narrative ?CLINICAL DATA:  Left-sided flank pain. Evaluate for nephrolithiasis. ? ?EXAM: ?CT ABDOMEN AND PELVIS  WITHOUT CONTRAST ? ?TECHNIQUE: ?Multidetector CT imaging of the abdomen and pelvis was performed ?following the standard protocol without IV contrast. ? ?COMPARISON:  None. ? ?FINDINGS: ?The lack of intravenous contrast limits the ability to evaluate ?solid abdominal organs. ? ?Lower chest: Limited visualization of the lower thorax demonstrates ?minimal bibasilar subpleural ground-glass atelectasis. No discrete ?focal airspace opacities. No pleural effusion ? ?Normal heart size scratch the borderline cardiomegaly. No ?pericardial effusion. ? ?Hepatobiliary: Normal hepatic contour. There is diffuse decreased ?attenuation hepatic parenchyma with relative sparing of the caudate, ?findings compatible with hepatic steatosis. Normal noncontrast ?appearance of the gallbladder. No radiopaque gallstones. No ascites. ? ?Pancreas: Normal noncontrast appearance of the pancreas. ? ?Spleen: Normal noncontrast appearance of the spleen. Note is made of ?a punctate splenule about the hilum. ? ?Adrenals/Urinary Tract: There is a punctate (2 mm) stone within the ?distal aspect of the left ureter just superior to the left UVJ ?(image 89, series 2) with associated mild upstream ureterectasis, ?pelvicaliectasis and asymmetric Peri ureteral and perinephric ?stranding. There is an additional punctate (4 mm) nonobstructing ?stone within the inferior pole the left kidney (image 46, series 2). ? ?No evidence of right-sided nephrolithiasis. No renal stones are seen ?along the expected course of the right ureter or within the urinary ?bladder. No evidence of right-sided urinary obstruction. ? ?Normal noncontrast appearance the bilateral adrenal glands. ? ?Normal noncontrast appearance of the urinary bladder given degree of ?distention. ? ?Stomach/Bowel: Scattered colonic diverticulosis without evidence of ?superimposed acute diverticulitis. Moderate colonic stool burden ?without evidence of enteric obstruction. Normal noncontrast ?appearance of  the terminal ileum and the appendix. No discrete areas ?of bowel wall thickening on this noncontrast examination. No ?significant hiatal hernia. No pneumoperitoneum, pneumatosis or ?portal venous gas. ? ?Vascular/Lymphatic: Minimal amount of atherosclerotic plaque ?involving the right common iliac artery. Normal caliber the ?abdominal aorta. ? ?Scattered retroperitoneal lymph nodes are numerous though ?individually not enlarged by size criteria with index aortocaval ?lymph node measuring 0.8 cm in greatest short axis diameter (image ?41, series 2), presumably reactive in etiology. ? ?Reproductive: Dystrophic calcifications within normal sized prostate ?gland. Post vasectomy. No free fluid within the pelvic cul-de-sac. ? ?Other: Tiny mesenteric fat containing periumbilical hernia. There is ?a minimal amount of subcutaneous edema about the midline of the low ?back. ? ?Musculoskeletal: No acute or aggressive osseous abnormalities. ?Mild-to-moderate multilevel lumbar spine DDD, worse at L4-L5 with ?disc space height loss, endplate irregularity and sclerosis. ?Stigmata of dish within the lower thoracic spine. ? ?IMPRESSION: ?1. Punctate (2 mm) stone within the distal aspect of the left ureter ?just superior to the left UVJ results in mild upstream ?ureterectasis, pelvicaliectasis and asymmetric left-sided ?periureteral and perinephric stranding. ?2. Solitary additional punctate (4 mm) nonobstructing stone within ?the inferior pole of the left kidney. ?3. No evidence of right-sided nephrolithiasis or urinary ?obstruction. ?4. Colonic diverticulosis without evidence of superimposed acute ?diverticulitis. ?5.  Suspected advanced hepatic steatosis. Correlation with LFTs is ?advised. ? ? ?Electronically Signed ?By: Simonne Come M.D. ?On: 07/07/2020 15:07 ? ? ?Assessment & Plan:   ? ?1.  Gross hematuria ?1 year history intermittent gross hematuria ?We discussed the recommended evaluation of gross hematuria which includes CT  urogram and cystoscopy.  The procedures were discussed and he has elected to proceed ?He would like preprocedure anxiolytic and Rx Valium 10 mg sent to pharmacy.  He will have a driver  ? ?2.  Nephrolithiasis ?Will evalua

## 2021-06-12 NOTE — Progress Notes (Unsigned)
06/12/2021 1:08 PM   Shawn Sheppard 05-03-1970 [id number]  Referring provider: Orene Desanctis, MD [address] [place],  [place] [address]  Chief Complaint  Patient presents with   Nephrolithiasis    HPI: Shawn Sheppard is a 51 y.o. who presents for evaluation of hematuria.  Complaining of intermittent gross hematuria which started in early 2022 States at least every other week he will have episodes of gross hematuria Sometimes associated with frequency and mild penile pain however other times are asymptomatic Last episode was yesterday and he brought in a video of his void-the urine was coffee colored initially then followed by slightly burgundy colored urine.  No clots noted Evaluated [facility] ED 07/07/2020 with left lower quadrant/left flank pain.  Stone protocol CT showed a 2 mm left distal ureteral calculus and a 4 mm nonobstructing left lower pole calculus Prior history of torsion status post detorsion/bilateral orchiopexy 2006  PMH: Past Medical History:  Diagnosis Date   Anxiety    Hypothyroidism    Thyroid disease     Surgical History: Past Surgical History:  Procedure Laterality Date   DISTAL BICEPS TENDON REPAIR Right 03/08/2020   Procedure: DISTAL BICEPS TENDON REPAIR;  Surgeon: Christena Flake, MD;  Location: [facility] ORS;  Service: Orthopedics;  Laterality: Right;   TESTICLE TORSION REDUCTION Left 2006   VASECTOMY      Home Medications:  Allergies as of 06/12/2021       Reactions   Augmentin [amoxicillin-pot Clavulanate]    Sulfa Antibiotics    Penicillins Rash   Tramadol Rash        Medication List        Accurate as of June 12, 2021  1:08 PM. If you have any questions, ask your nurse or doctor.          STOP taking these medications    cephALEXin 500 MG capsule Commonly known as: KEFLEX Stopped by: Riki Altes, MD   ketorolac 10 MG tablet Commonly known as: TORADOL Stopped by: Riki Altes, MD   ondansetron 4 MG disintegrating  tablet Commonly known as: Zofran ODT Stopped by: Riki Altes, MD   oxyCODONE 5 MG immediate release tablet Commonly known as: Roxicodone Stopped by: Riki Altes, MD   tamsulosin 0.4 MG Caps capsule Commonly known as: FLOMAX Stopped by: Riki Altes, MD       TAKE these medications    ibuprofen 600 MG tablet Commonly known as: ADVIL Take 1 tablet (600 mg total) by mouth every 6 (six) hours as needed.   levothyroxine 50 MCG tablet Commonly known as: SYNTHROID Take 50 mcg by mouth daily before breakfast.   ranitidine 150 MG tablet Commonly known as: ZANTAC Take 150 mg by mouth 2 (two) times daily.   sertraline 100 MG tablet Commonly known as: ZOLOFT Take 100 mg by mouth daily.   zaleplon 10 MG capsule Commonly known as: SONATA Take 10 mg by mouth at bedtime as needed for sleep.        Allergies:  Allergies  Allergen Reactions   Augmentin [Amoxicillin-Pot Clavulanate]    Sulfa Antibiotics    Penicillins Rash   Tramadol Rash    Family History: No family history on file.  Social History:  reports that he has never smoked. He has never used smokeless tobacco. He reports that he does not drink alcohol and does not use drugs.   Physical Exam: BP 120/76    Pulse 64    Ht 5'  10" (1.778 m)    Wt 243 lb (110.2 kg)    BMI 34.87 kg/m   Constitutional:  Alert and oriented, No acute distress. HEENT: [place] AT, moist mucus membranes.  Trachea midline, no masses. Cardiovascular: No clubbing, cyanosis, or edema. Respiratory: Normal respiratory effort, no increased work of breathing. Neurologic: Grossly intact, no focal deficits, moving all 4 extremities. Psychiatric: Normal mood and affect.  Laboratory Data:  Urinalysis Microscopy 21-50 RBC/6-10 WBC  Pertinent Imaging: CT images were personally reviewed and interpreted  CT Renal Stone Study  Narrative CLINICAL DATA:  Left-sided flank pain. Evaluate for nephrolithiasis.  EXAM: CT ABDOMEN AND PELVIS  WITHOUT CONTRAST  TECHNIQUE: Multidetector CT imaging of the abdomen and pelvis was performed following the standard protocol without IV contrast.  COMPARISON:  None.  FINDINGS: The lack of intravenous contrast limits the ability to evaluate solid abdominal organs.  Lower chest: Limited visualization of the lower thorax demonstrates minimal bibasilar subpleural ground-glass atelectasis. No discrete focal airspace opacities. No pleural effusion  Normal heart size scratch the borderline cardiomegaly. No pericardial effusion.  Hepatobiliary: Normal hepatic contour. There is diffuse decreased attenuation hepatic parenchyma with relative sparing of the caudate, findings compatible with hepatic steatosis. Normal noncontrast appearance of the gallbladder. No radiopaque gallstones. No ascites.  Pancreas: Normal noncontrast appearance of the pancreas.  Spleen: Normal noncontrast appearance of the spleen. Note is made of a punctate splenule about the hilum.  Adrenals/Urinary Tract: There is a punctate (2 mm) stone within the distal aspect of the left ureter just superior to the left UVJ (image 89, series 2) with associated mild upstream ureterectasis, pelvicaliectasis and asymmetric Peri ureteral and perinephric stranding. There is an additional punctate (4 mm) nonobstructing stone within the inferior pole the left kidney (image 46, series 2).  No evidence of right-sided nephrolithiasis. No renal stones are seen along the expected course of the right ureter or within the urinary bladder. No evidence of right-sided urinary obstruction.  Normal noncontrast appearance the bilateral adrenal glands.  Normal noncontrast appearance of the urinary bladder given degree of distention.  Stomach/Bowel: Scattered colonic diverticulosis without evidence of superimposed acute diverticulitis. Moderate colonic stool burden without evidence of enteric obstruction. Normal noncontrast appearance of  the terminal ileum and the appendix. No discrete areas of bowel wall thickening on this noncontrast examination. No significant hiatal hernia. No pneumoperitoneum, pneumatosis or portal venous gas.  Vascular/Lymphatic: Minimal amount of atherosclerotic plaque involving the right common iliac artery. Normal caliber the abdominal aorta.  Scattered retroperitoneal lymph nodes are numerous though individually not enlarged by size criteria with index aortocaval lymph node measuring 0.8 cm in greatest short axis diameter (image 41, series 2), presumably reactive in etiology.  Reproductive: Dystrophic calcifications within normal sized prostate gland. Post vasectomy. No free fluid within the pelvic cul-de-sac.  Other: Tiny mesenteric fat containing periumbilical hernia. There is a minimal amount of subcutaneous edema about the midline of the low back.  Musculoskeletal: No acute or aggressive osseous abnormalities. Mild-to-moderate multilevel lumbar spine DDD, worse at L4-L5 with disc space height loss, endplate irregularity and sclerosis. Stigmata of dish within the lower thoracic spine.  IMPRESSION: 1. Punctate (2 mm) stone within the distal aspect of the left ureter just superior to the left UVJ results in mild upstream ureterectasis, pelvicaliectasis and asymmetric left-sided periureteral and perinephric stranding. 2. Solitary additional punctate (4 mm) nonobstructing stone within the inferior pole of the left kidney. 3. No evidence of right-sided nephrolithiasis or urinary obstruction. 4. Colonic diverticulosis without evidence of  superimposed acute diverticulitis. 5. Suspected advanced hepatic steatosis. Correlation with LFTs is advised.   Electronically Signed By: Simonne ComeJohn  Watts M.D. On: 07/07/2020 15:07   Assessment & Plan:    1.  Gross hematuria 1 year history intermittent gross hematuria We discussed the recommended evaluation of gross hematuria which includes CT  urogram and cystoscopy.  The procedures were discussed and he has elected to proceed He would like preprocedure anxiolytic and Rx Valium 10 mg sent to pharmacy.  He will have a driver ***rx/ctu order  2.  Nephrolithiasis Will evaluate further with CTU   Riki AltesScott C Aliha Diedrich, MD  [facility] [address], [address] [place], [place] [address] [phone]

## 2021-06-13 ENCOUNTER — Encounter: Payer: Self-pay | Admitting: Urology

## 2021-06-13 ENCOUNTER — Other Ambulatory Visit: Payer: Self-pay

## 2021-06-13 ENCOUNTER — Emergency Department: Payer: BC Managed Care – PPO

## 2021-06-13 ENCOUNTER — Encounter: Payer: Self-pay | Admitting: Emergency Medicine

## 2021-06-13 ENCOUNTER — Emergency Department
Admission: EM | Admit: 2021-06-13 | Discharge: 2021-06-13 | Disposition: A | Discharge status: Home / Self Care | Payer: BC Managed Care – PPO | Attending: Emergency Medicine | Admitting: Emergency Medicine

## 2021-06-13 DIAGNOSIS — D72829 Elevated white blood cell count, unspecified: Secondary | ICD-10-CM | POA: Diagnosis not present

## 2021-06-13 DIAGNOSIS — N132 Hydronephrosis with renal and ureteral calculous obstruction: Secondary | ICD-10-CM | POA: Insufficient documentation

## 2021-06-13 DIAGNOSIS — N200 Calculus of kidney: Secondary | ICD-10-CM

## 2021-06-13 DIAGNOSIS — R319 Hematuria, unspecified: Secondary | ICD-10-CM | POA: Diagnosis present

## 2021-06-13 LAB — CBC
HCT: 43.6 % (ref 39.0–52.0)
Hemoglobin: 15 g/dL (ref 13.0–17.0)
MCH: 30.2 pg (ref 26.0–34.0)
MCHC: 34.4 g/dL (ref 30.0–36.0)
MCV: 87.7 fL (ref 80.0–100.0)
Platelets: 163 10*3/uL (ref 150–400)
RBC: 4.97 MIL/uL (ref 4.22–5.81)
RDW: 12.4 % (ref 11.5–15.5)
WBC: 13 10*3/uL — ABNORMAL HIGH (ref 4.0–10.5)
nRBC: 0 % (ref 0.0–0.2)

## 2021-06-13 LAB — BASIC METABOLIC PANEL
Anion gap: 8 (ref 5–15)
BUN: 21 mg/dL — ABNORMAL HIGH (ref 6–20)
CO2: 26 mmol/L (ref 22–32)
Calcium: 9.1 mg/dL (ref 8.9–10.3)
Chloride: 102 mmol/L (ref 98–111)
Creatinine, Ser: 1.21 mg/dL (ref 0.61–1.24)
GFR, Estimated: >60 mL/min (ref >60)
Glucose, Bld: 115 mg/dL — ABNORMAL HIGH (ref 70–99)
Potassium: 4.6 mmol/L (ref 3.5–5.1)
Sodium: 136 mmol/L (ref 135–145)

## 2021-06-13 LAB — URINALYSIS, ROUTINE W REFLEX MICROSCOPIC
Bilirubin Urine: NEGATIVE
Glucose, UA: NEGATIVE mg/dL
Ketones, ur: NEGATIVE mg/dL
Leukocytes,Ua: NEGATIVE
Nitrite: NEGATIVE
Protein, ur: NEGATIVE mg/dL
Specific Gravity, Urine: 1.01 (ref 1.005–1.030)
pH: 5 (ref 5.0–8.0)

## 2021-06-13 LAB — HEPATIC FUNCTION PANEL
ALT: 40 U/L (ref 0–44)
AST: 34 U/L (ref 15–41)
Albumin: 4.4 g/dL (ref 3.5–5.0)
Alkaline Phosphatase: 45 U/L (ref 38–126)
Bilirubin, Direct: 0.2 mg/dL (ref 0.0–0.2)
Indirect Bilirubin: 0.6 mg/dL (ref 0.3–0.9)
Total Bilirubin: 0.8 mg/dL (ref 0.3–1.2)
Total Protein: 7 g/dL (ref 6.5–8.1)

## 2021-06-13 LAB — LIPASE, BLOOD: Lipase: 45 U/L (ref 11–51)

## 2021-06-13 MED ORDER — TAMSULOSIN HCL 0.4 MG PO CAPS: 0.4000 mg | ORAL_CAPSULE | Freq: Every day | ORAL | 0 refills | Status: DC

## 2021-06-13 MED ORDER — ONDANSETRON HCL 4 MG/2ML IJ SOLN
4.0000 mg | Freq: Once | INTRAMUSCULAR | Status: AC
  Administered 2021-06-13: 09:00:00 4 mg via INTRAVENOUS
  Filled 2021-06-13: qty 2

## 2021-06-13 MED ORDER — SODIUM CHLORIDE 0.9 % IV BOLUS
1000.0000 mL | Freq: Once | INTRAVENOUS | Status: AC
  Administered 2021-06-13: 09:00:00 1000 mL via INTRAVENOUS

## 2021-06-13 MED ORDER — KETOROLAC TROMETHAMINE 30 MG/ML IJ SOLN
30.0000 mg | Freq: Once | INTRAMUSCULAR | Status: AC
  Administered 2021-06-13: 09:00:00 30 mg via INTRAVENOUS
  Filled 2021-06-13: qty 1

## 2021-06-13 MED ORDER — ONDANSETRON 4 MG PO TBDP: 4.0000 mg | ORAL_TABLET | Freq: Three times a day (TID) | ORAL | 0 refills | Status: DC | PRN

## 2021-06-13 MED ORDER — KETOROLAC TROMETHAMINE 10 MG PO TABS: 10.0000 mg | ORAL_TABLET | Freq: Four times a day (QID) | ORAL | 0 refills | Status: DC | PRN

## 2021-06-13 NOTE — ED Notes (Signed)
ED Provider at bedside. 

## 2021-06-13 NOTE — Discharge Instructions (Signed)
Please take your medications as needed, as prescribed.  Please drink plenty of fluids.  If you develop a fever or worsening pain please return to the emergency department for evaluation.  Otherwise please follow-up with urology by calling today to inform them of your kidney stone and today's ER visit. ?

## 2021-06-13 NOTE — ED Provider Notes (Signed)
? ?[facility] ?Provider Note ? ? ? Event Date/Time  ? First MD Initiated Contact with Patient 06/13/21 [phone]   ?  (approximate) ? ?History  ? ?Chief Complaint: Flank Pain ? ?HPI ? ?Shawn Sheppard is a 51 y.o. male with a past medical history of anxiety, kidney stone, presents to the emergency department for hematuria.  According to the patient over the past 1 year he has intermittently been experiencing hematuria, has been following up with urology.  States he has had a kidney stone previously.  Over the past 3 days patient has noted hematuria once again, but starting this morning developed left flank pain which he describes as moderate to significant sharp type pain.  Patient urinated this morning denies dysuria but states hematuria.  No vomiting or diarrhea.  No known fever.  Patient states he went to urology yesterday for recheck his urine and ordered a CT scan but has not for several more days. ? ?Physical Exam  ? ?Triage Vital Signs: ?ED Triage Vitals  ?Enc Vitals Group  ?   BP 06/13/21 0843 (!) 145/89  ?   Pulse Rate 06/13/21 0843 66  ?   Resp 06/13/21 0843 20  ?   Temp 06/13/21 0843 98.6 ?F (37 ?C)  ?   Temp Source 06/13/21 0843 Oral  ?   SpO2 06/13/21 0843 98 %  ?   Weight 06/13/21 0841 242 lb 15.2 oz (110.2 kg)  ?   Height 06/13/21 0841 5\' 10"  (1.778 m)  ?   Head Circumference --   ?   Peak Flow --   ?   Pain Score 06/13/21 0841 9  ?   Pain Loc --   ?   Pain Edu? --   ?   Excl. in GC? --   ? ? ?Most recent vital signs: ?Vitals:  ? 06/13/21 0843  ?BP: (!) 145/89  ?Pulse: 66  ?Resp: 20  ?Temp: 98.6 ?F (37 ?C)  ?SpO2: 98%  ? ? ?General: Awake, no distress.  ?CV:  Good peripheral perfusion.  Regular rate and rhythm  ?Resp:  Normal effort.  Equal breath sounds bilaterally.  ?Abd:  No distention.  Soft, slight left lower quadrant tenderness to palpation otherwise benign abdomen without rebound or guarding. ? ? ? ?ED Results / Procedures / Treatments  ? ?RADIOLOGY ? ?I personally reviewed the  CT images, patient appears to have a left mid ureteral stone. ?Radiology is read the CT as a 6 mm distal left ureteral stone with moderate left-sided hydronephrosis. ? ? ? ?MEDICATIONS ORDERED IN ED: ?Medications  ?ketorolac (TORADOL) 30 MG/ML injection 30 mg (has no administration in time range)  ?ondansetron (ZOFRAN) injection 4 mg (has no administration in time range)  ?sodium chloride 0.9 % bolus 1,000 mL (has no administration in time range)  ? ? ? ?IMPRESSION / MDM / ASSESSMENT AND PLAN / ED COURSE  ?I reviewed the triage vital signs and the nursing notes. ? ?Patient presents to the emergency department for left flank pain starting this morning as well as intermittent hematuria.  Vital signs are reassuring.  Patient has slight left lower quadrant tenderness on examination.  We will check labs, urinalysis and obtain CT imaging of abdomen/pelvis.  Symptoms are very suggestive of ureterolithiasis, differential would also include this UTI, pyelonephritis/cystitis, less likely oncologic process, other intra-abdominal pathology. ? ?Patient's work-up is consistent with a left-sided mid ureteral kidney stone.  Patient's urinalysis shows hematuria but no sign of infection.  CBC  is largely within normal limit besides slight leukocytosis.  Chemistry is normal.  Patient is feeling much better after Toradol states minimal to no pain currently.  I discussed return precautions for any fever worsening discomfort I also discussed urology follow-up with patient.  Patient does not wish for any narcotic medication we will prescribe oral Toradol as well as Zofran to be used as needed.  We will place patient on Flomax.  Patient will follow-up with his urologist today. ? ?FINAL CLINICAL IMPRESSION(S) / ED DIAGNOSES  ? ?Hematuria ?Flank pain ?Kidney stone ? ?Rx / DC Orders  ? ?Toradol ?Flomax ?Zofran ?Urology follow-up ? ?Note:  This document was prepared using Dragon voice recognition software and may include unintentional dictation  errors. ?  ?Minna Antis, MD ?06/13/21 1053 ? ?

## 2021-06-13 NOTE — ED Triage Notes (Signed)
Pt comes into the ED via POV c/o left flank pain that started this morning.  PT has been having hematuria for a while and has been following up with urology for multiple studies.  Currently they believe he has a kidney stone.  PT appears uncomfortable in triage at this time. Pt still able to urinate at this time.  ?

## 2021-06-14 LAB — URINE CULTURE: Culture: NO GROWTH

## 2021-06-18 ENCOUNTER — Ambulatory Visit
Admission: RE | Admit: 2021-06-18 | Discharge: 2021-06-18 | Disposition: A | Discharge status: Home / Self Care | Payer: BC Managed Care – PPO | Attending: Urology | Admitting: Urology

## 2021-06-18 ENCOUNTER — Ambulatory Visit
Admission: RE | Admit: 2021-06-18 | Discharge: 2021-06-18 | Disposition: A | Discharge status: Home / Self Care | Payer: BC Managed Care – PPO | Source: Ambulatory Visit | Attending: Urology | Admitting: Urology

## 2021-06-18 DIAGNOSIS — N200 Calculus of kidney: Secondary | ICD-10-CM | POA: Insufficient documentation

## 2021-06-19 ENCOUNTER — Encounter: Payer: Self-pay | Admitting: Urology

## 2021-06-21 ENCOUNTER — Other Ambulatory Visit: Payer: Self-pay | Admitting: Urology

## 2021-06-21 DIAGNOSIS — N201 Calculus of ureter: Secondary | ICD-10-CM

## 2021-06-21 NOTE — Progress Notes (Signed)
Surgical Physician Order Form [facility] ? ?* Scheduling expectation :  Tuesday 3/21 ? ?*Length of Case: 45 minutes ? ?*Clearance needed: no ? ?*Anticoagulation Instructions: N/A ? ?*Aspirin Instructions: N/A ? ?*Post-op visit Date/Instructions:  1 month follow up ? ?*Diagnosis: Left Ureteral Stone ? ?*Procedure: Left Ureteroscopy w/laser lithotripsy & stent placement (14481) ? ? ?Additional orders: N/A ? ?-Admit type: OUTpatient ? ?-Anesthesia: Choice ? ?-VTE Prophylaxis Standing Order SCD?s    ?   ?Other:  ? ?-Standing Lab Orders Per Anesthesia   ? ?Lab other: None ? ?-Standing Test orders EKG/Chest x-ray per Anesthesia      ? ?Test other:  ? ?- Medications:  Ancef 2gm IV ? ?-Other orders:  Ok to proceed with Ancef PCN allergy reviewed ? ? ? ?  ? ?

## 2021-06-24 ENCOUNTER — Telehealth: Payer: Self-pay

## 2021-06-24 NOTE — Telephone Encounter (Signed)
I spoke with Shawn Sheppard. We have discussed possible surgery dates and Tuesday March 21st, 2023 was agreed upon by all parties. Patient given information about surgery date, what to expect pre-operatively and post operatively.  ?We discussed that a Pre-Admission Testing office will be calling to set up the pre-op visit that will take place prior to surgery, and that these appointments are typically done over the phone with a Pre-Admissions RN.  ?Informed patient that our office will communicate any additional care to be provided after surgery. Patients questions or concerns were discussed during our call. Advised to call our office should there be any additional information, questions or concerns that arise. Patient verbalized understanding.  ? ?

## 2021-06-24 NOTE — Progress Notes (Signed)
[facility] Posting Form  ? ?Surgery Date/Time: Date: 06/25/2021 ? ?Surgeon: Dr. Irineo Axon, MD ? ?Surgery Location: Day Surgery ? ?Inpt ( No  )   Outpt (Yes)   Obs ( No  )  ? ?Diagnosis: N20.1 Left Ureteral Stone ? ?-CPT: 52356 ? ?Surgery: Left Ureteroscopy with laser lithotripsy and stent placement ? ?Stop Anticoagulations: N/A ? ?Cardiac/Medical/Pulmonary Clearance needed: no ? ?*Orders entered into EPIC  Date: 06/24/21  ? ?*Case booked in [place]  Date: 06/24/21 ? ?*Notified pt of Surgery: Date: 06/24/21 ? ?PRE-OP UA & CX: no ? ?*Placed into Prior Authorization Work Que Date: 06/24/21 ? ? ?Assistant/laser/rep:No ? ? ? ? ? ? ? ? ? ? ? ? ? ? ? ?

## 2021-06-25 ENCOUNTER — Ambulatory Visit: Payer: BC Managed Care – PPO | Admitting: Anesthesiology

## 2021-06-25 ENCOUNTER — Other Ambulatory Visit: Payer: Self-pay

## 2021-06-25 ENCOUNTER — Ambulatory Visit: Payer: BC Managed Care – PPO

## 2021-06-25 ENCOUNTER — Encounter
Admission: RE | Disposition: A | Discharge status: Home / Self Care | Payer: Self-pay | Source: Ambulatory Visit | Attending: Urology

## 2021-06-25 ENCOUNTER — Ambulatory Visit
Admission: RE | Admit: 2021-06-25 | Discharge: 2021-06-25 | Disposition: A | Discharge status: Home / Self Care | Payer: BC Managed Care – PPO | Source: Ambulatory Visit | Attending: Urology | Admitting: Urology

## 2021-06-25 ENCOUNTER — Other Ambulatory Visit: Payer: Self-pay | Admitting: Urology

## 2021-06-25 ENCOUNTER — Encounter: Payer: Self-pay | Admitting: Urology

## 2021-06-25 DIAGNOSIS — E039 Hypothyroidism, unspecified: Secondary | ICD-10-CM | POA: Diagnosis not present

## 2021-06-25 DIAGNOSIS — N201 Calculus of ureter: Secondary | ICD-10-CM | POA: Diagnosis present

## 2021-06-25 DIAGNOSIS — N23 Unspecified renal colic: Secondary | ICD-10-CM | POA: Diagnosis not present

## 2021-06-25 DIAGNOSIS — G4730 Sleep apnea, unspecified: Secondary | ICD-10-CM | POA: Insufficient documentation

## 2021-06-25 HISTORY — PX: CYSTOSCOPY W/ RETROGRADES: SHX1426

## 2021-06-25 HISTORY — PX: CYSTOSCOPY/URETEROSCOPY/HOLMIUM LASER/STENT PLACEMENT: SHX6546

## 2021-06-25 SURGERY — CYSTOSCOPY/URETEROSCOPY/HOLMIUM LASER/STENT PLACEMENT
Anesthesia: General | Laterality: Left

## 2021-06-25 MED ORDER — OXYCODONE HCL 5 MG PO TABS
ORAL_TABLET | ORAL | Status: AC
  Filled 2021-06-25: qty 1

## 2021-06-25 MED ORDER — MIDAZOLAM HCL 2 MG/2ML IJ SOLN
INTRAMUSCULAR | Status: DC | PRN
  Administered 2021-06-25: 2 mg via INTRAVENOUS

## 2021-06-25 MED ORDER — OXYCODONE HCL 5 MG PO TABS: 5.0000 mg | ORAL_TABLET | Freq: Four times a day (QID) | ORAL | 0 refills | Status: DC | PRN

## 2021-06-25 MED ORDER — FENTANYL CITRATE (PF) 100 MCG/2ML IJ SOLN: 25.0000 ug | INTRAMUSCULAR | Status: DC | PRN

## 2021-06-25 MED ORDER — FENTANYL CITRATE (PF) 100 MCG/2ML IJ SOLN
INTRAMUSCULAR | Status: AC
  Filled 2021-06-25: qty 2

## 2021-06-25 MED ORDER — OXYCODONE HCL 5 MG PO TABS
5.0000 mg | ORAL_TABLET | Freq: Once | ORAL | Status: AC | PRN
  Administered 2021-06-25: 5 mg via ORAL

## 2021-06-25 MED ORDER — CHLORHEXIDINE GLUCONATE 0.12 % MT SOLN: 15.0000 mL | Freq: Once | OROMUCOSAL | Status: AC

## 2021-06-25 MED ORDER — PROPOFOL 10 MG/ML IV BOLUS
INTRAVENOUS | Status: AC
  Filled 2021-06-25: qty 20

## 2021-06-25 MED ORDER — OXYBUTYNIN CHLORIDE 5 MG PO TABS: ORAL_TABLET | ORAL | 0 refills | Status: DC

## 2021-06-25 MED ORDER — DEXAMETHASONE SODIUM PHOSPHATE 10 MG/ML IJ SOLN
INTRAMUSCULAR | Status: DC | PRN
  Administered 2021-06-25: 10 mg via INTRAVENOUS

## 2021-06-25 MED ORDER — MIDAZOLAM HCL 2 MG/2ML IJ SOLN
INTRAMUSCULAR | Status: AC
Start: 2021-06-25 — End: ?
  Filled 2021-06-25: qty 2

## 2021-06-25 MED ORDER — OXYBUTYNIN CHLORIDE 5 MG PO TABS
5.0000 mg | ORAL_TABLET | Freq: Three times a day (TID) | ORAL | Status: DC
Start: 2021-06-25 — End: 2021-06-25

## 2021-06-25 MED ORDER — ORAL CARE MOUTH RINSE: 15.0000 mL | Freq: Once | OROMUCOSAL | Status: AC

## 2021-06-25 MED ORDER — IOHEXOL 180 MG/ML  SOLN
INTRAMUSCULAR | Status: DC | PRN
Start: 2021-06-25 — End: 2021-06-25
  Administered 2021-06-25: 20 mL

## 2021-06-25 MED ORDER — HYDROCODONE-ACETAMINOPHEN 5-325 MG PO TABS: 1.0000 | ORAL_TABLET | Freq: Four times a day (QID) | ORAL | 0 refills | Status: DC | PRN

## 2021-06-25 MED ORDER — OXYCODONE HCL 5 MG/5ML PO SOLN: 5.0000 mg | Freq: Once | ORAL | Status: AC | PRN

## 2021-06-25 MED ORDER — ONDANSETRON HCL 4 MG/2ML IJ SOLN
INTRAMUSCULAR | Status: DC | PRN
  Administered 2021-06-25: 4 mg via INTRAVENOUS

## 2021-06-25 MED ORDER — FENTANYL CITRATE (PF) 100 MCG/2ML IJ SOLN
INTRAMUSCULAR | Status: DC | PRN
  Administered 2021-06-25: 50 ug via INTRAVENOUS

## 2021-06-25 MED ORDER — CEFAZOLIN SODIUM-DEXTROSE 2-4 GM/100ML-% IV SOLN
2.0000 g | INTRAVENOUS | Status: AC
  Administered 2021-06-25: 2 g via INTRAVENOUS

## 2021-06-25 MED ORDER — FENTANYL CITRATE (PF) 100 MCG/2ML IJ SOLN
INTRAMUSCULAR | Status: AC
Start: 2021-06-25 — End: ?
  Filled 2021-06-25: qty 2

## 2021-06-25 MED ORDER — LIDOCAINE HCL (CARDIAC) PF 100 MG/5ML IV SOSY
PREFILLED_SYRINGE | INTRAVENOUS | Status: DC | PRN
  Administered 2021-06-25: 50 mg via INTRAVENOUS

## 2021-06-25 MED ORDER — PROPOFOL 10 MG/ML IV BOLUS
INTRAVENOUS | Status: DC | PRN
  Administered 2021-06-25: 200 mg via INTRAVENOUS

## 2021-06-25 MED ORDER — SODIUM CHLORIDE 0.9 % IR SOLN
Status: DC | PRN
  Administered 2021-06-25: 3000 mL

## 2021-06-25 MED ORDER — LACTATED RINGERS IV SOLN: INTRAVENOUS | Status: DC

## 2021-06-25 MED ORDER — CHLORHEXIDINE GLUCONATE 0.12 % MT SOLN
OROMUCOSAL | Status: AC
  Administered 2021-06-25: 15 mL via OROMUCOSAL
  Filled 2021-06-25: qty 15

## 2021-06-25 MED ORDER — OXYBUTYNIN CHLORIDE 5 MG PO TABS
ORAL_TABLET | ORAL | Status: AC
  Administered 2021-06-25: 5 mg via ORAL
  Filled 2021-06-25: qty 1

## 2021-06-25 MED ORDER — CEFAZOLIN SODIUM-DEXTROSE 2-4 GM/100ML-% IV SOLN
INTRAVENOUS | Status: AC
  Filled 2021-06-25: qty 100

## 2021-06-25 SURGICAL SUPPLY — 32 items
6F Open ended ureteral catheter ×1 IMPLANT
BAG DRAIN CYSTO-URO LG1000N (MISCELLANEOUS) ×2 IMPLANT
BASKET ZERO TIP 1.9FR (BASKET) ×1 IMPLANT
BRUSH SCRUB 4% CHG (MISCELLANEOUS) ×1 IMPLANT
BRUSH SCRUB EZ 1% IODOPHOR (MISCELLANEOUS) ×1 IMPLANT
BSKT STON RTRVL ZERO TP 1.9FR (BASKET) ×1
CATH URET FLEX-TIP 2 LUMEN 10F (CATHETERS) IMPLANT
CATH URETL OPEN END 6X70 (CATHETERS) IMPLANT
CNTNR SPEC 2.5X3XGRAD LEK (MISCELLANEOUS)
CONT SPEC 4OZ STER OR WHT (MISCELLANEOUS)
CONT SPEC 4OZ STRL OR WHT (MISCELLANEOUS)
CONTAINER SPEC 2.5X3XGRAD LEK (MISCELLANEOUS) IMPLANT
DRAPE UTILITY 15X26 TOWEL STRL (DRAPES) ×2 IMPLANT
GAUZE 4X4 16PLY ~~LOC~~+RFID DBL (SPONGE) ×3 IMPLANT
GLOVE SURG UNDER POLY LF SZ7.5 (GLOVE) ×2 IMPLANT
GOWN STRL REUS W/ TWL LRG LVL3 (GOWN DISPOSABLE) ×1 IMPLANT
GOWN STRL REUS W/ TWL XL LVL3 (GOWN DISPOSABLE) ×1 IMPLANT
GOWN STRL REUS W/TWL LRG LVL3 (GOWN DISPOSABLE) ×2
GOWN STRL REUS W/TWL XL LVL3 (GOWN DISPOSABLE) ×2
GUIDEWIRE STR DUAL SENSOR (WIRE) ×3 IMPLANT
INFUSOR MANOMETER BAG 3000ML (MISCELLANEOUS) ×1 IMPLANT
IV NS IRRIG 3000ML ARTHROMATIC (IV SOLUTION) ×2 IMPLANT
KIT TURNOVER CYSTO (KITS) ×2 IMPLANT
PACK CYSTO AR (MISCELLANEOUS) ×2 IMPLANT
SET CYSTO W/LG BORE CLAMP LF (SET/KITS/TRAYS/PACK) ×2 IMPLANT
SHEATH URETERAL 12FRX35CM (MISCELLANEOUS) IMPLANT
STENT URET 6FRX24 CONTOUR (STENTS) IMPLANT
STENT URET 6FRX26 CONTOUR (STENTS) ×1 IMPLANT
SURGILUBE 2OZ TUBE FLIPTOP (MISCELLANEOUS) ×2 IMPLANT
TRACTIP FLEXIVA PULSE ID 200 (Laser) ×2 IMPLANT
VALVE UROSEAL ADJ ENDO (VALVE) IMPLANT
WATER STERILE IRR 500ML POUR (IV SOLUTION) ×2 IMPLANT

## 2021-06-25 NOTE — Anesthesia Postprocedure Evaluation (Signed)
Anesthesia Post Note ? ?Patient: Shawn Sheppard ? ?Procedure(s) Performed: CYSTOSCOPY/URETEROSCOPY/HOLMIUM LASER/STONE EXTRACTION/STENT PLACEMENT (Left) ?CYSTOSCOPY WITH RETROGRADE PYELOGRAM (Left) ? ?Patient location during evaluation: PACU ?Anesthesia Type: General ?Level of consciousness: awake and alert ?Pain management: pain level controlled ?Vital Signs Assessment: post-procedure vital signs reviewed and stable ?Respiratory status: spontaneous breathing, nonlabored ventilation, respiratory function stable and patient connected to nasal cannula oxygen ?Cardiovascular status: blood pressure returned to baseline and stable ?Postop Assessment: no apparent nausea or vomiting ?Anesthetic complications: no ? ? ?No notable events documented. ? ? ?Last Vitals:  ?Vitals:  ? 06/25/21 1204 06/25/21 1206  ?BP: (!) 115/59   ?Pulse: (!) 59   ?Resp: 15   ?Temp: (!) 35.8 ?C   ?SpO2: 100% 96%  ?  ?Last Pain:  ?Vitals:  ? 06/25/21 1204  ?TempSrc:   ?PainSc: 6   ? ? ?  ?  ?  ?  ?  ?  ? ?Cleda Mccreedy Kidada Ging ? ? ? ? ?

## 2021-06-25 NOTE — Anesthesia Preprocedure Evaluation (Signed)
Anesthesia Evaluation  ?Patient identified by MRN, date of birth, ID band ?Patient awake ? ? ? ?Reviewed: ?Allergy & Precautions, NPO status , Patient's Chart, lab work & pertinent test results ? ?History of Anesthesia Complications ?Negative for: history of anesthetic complications ? ?Airway ?Mallampati: III ? ?TM Distance: <3 FB ?Neck ROM: full ? ? ? Dental ? ?(+) Chipped ?  ?Pulmonary ?neg shortness of breath, sleep apnea ,  ?  ?Pulmonary exam normal ? ? ? ? ? ? ? Cardiovascular ?Exercise Tolerance: Good ?(-) angina(-) Past MI and (-) DOE negative cardio ROS ?Normal cardiovascular exam ? ? ?  ?Neuro/Psych ?PSYCHIATRIC DISORDERS negative neurological ROS ?   ? GI/Hepatic ?negative GI ROS, Neg liver ROS, neg GERD  ,  ?Endo/Other  ?Hypothyroidism  ? Renal/GU ?  ? ?  ?Musculoskeletal ? ? Abdominal ?  ?Peds ? Hematology ?negative hematology ROS ?(+)   ?Anesthesia Other Findings ?Past Medical History: ?No date: Anxiety ?No date: Hypothyroidism ?No date: Thyroid disease ? ?Past Surgical History: ?03/08/2020: DISTAL BICEPS TENDON REPAIR; Right ?    Comment:  Procedure: DISTAL BICEPS TENDON REPAIR;  Surgeon: Roland Rack, ?             Marshall Cork, MD;  Location: [facility] ORS;  Service: Orthopedics;   ?             Laterality: Right; ?2006: TESTICLE TORSION REDUCTION; Left ?No date: VASECTOMY ? ?BMI   ? Body Mass Index: 34.87 kg/m?  ?  ? ? Reproductive/Obstetrics ?negative OB ROS ? ?  ? ? ? ? ? ? ? ? ? ? ? ? ? ?  ?  ? ? ? ? ? ? ? ? ?Anesthesia Physical ?Anesthesia Plan ? ?ASA: 3 ? ?Anesthesia Plan: General LMA  ? ?Post-op Pain Management:   ? ?Induction: Intravenous ? ?PONV Risk Score and Plan: Dexamethasone, Ondansetron, Midazolam and Treatment may vary due to age or medical condition ? ?Airway Management Planned: LMA ? ?Additional Equipment:  ? ?Intra-op Plan:  ? ?Post-operative Plan: Extubation in OR ? ?Informed Consent: I have reviewed the patients History and Physical, chart, labs and discussed the  procedure including the risks, benefits and alternatives for the proposed anesthesia with the patient or authorized representative who has indicated his/her understanding and acceptance.  ? ? ? ?Dental Advisory Given ? ?Plan Discussed with: Anesthesiologist, CRNA and Surgeon ? ?Anesthesia Plan Comments: (Patient consented for risks of anesthesia including but not limited to:  ?- adverse reactions to medications ?- damage to eyes, teeth, lips or other oral mucosa ?- nerve damage due to positioning  ?- sore throat or hoarseness ?- Damage to heart, brain, nerves, lungs, other parts of body or loss of life ? ?Patient voiced understanding.)  ? ? ? ? ? ? ?Anesthesia Quick Evaluation ? ?

## 2021-06-25 NOTE — Anesthesia Procedure Notes (Signed)
Procedure Name: LMA Insertion ?Date/Time: 06/25/2021 10:41 AM ?Performed by: Cheral Bay, CRNA ?Pre-anesthesia Checklist: Patient identified, Emergency Drugs available, Suction available and Patient being monitored ?Patient Re-evaluated:Patient Re-evaluated prior to induction ?Oxygen Delivery Method: Circle system utilized ?Preoxygenation: Pre-oxygenation with 100% oxygen ?Induction Type: IV induction ?Ventilation: Mask ventilation without difficulty ?LMA: LMA inserted ?LMA Size: 5.0 ?Tube type: Oral ?Number of attempts: 1 ?Placement Confirmation: positive ETCO2 and breath sounds checked- equal and bilateral ?Tube secured with: Tape ?Dental Injury: Teeth and Oropharynx as per pre-operative assessment  ? ? ? ? ?

## 2021-06-25 NOTE — Transfer of Care (Signed)
Immediate Anesthesia Transfer of Care Note ? ?Patient: Shawn Sheppard ? ?Procedure(s) Performed: CYSTOSCOPY/URETEROSCOPY/HOLMIUM LASER/STONE EXTRACTION/STENT PLACEMENT (Left) ?CYSTOSCOPY WITH RETROGRADE PYELOGRAM (Left) ? ?Patient Location: PACU ? ?Anesthesia Type:General ? ?Level of Consciousness: drowsy ? ?Airway & Oxygen Therapy: Patient Spontanous Breathing and Patient connected to face mask oxygen ? ?Post-op Assessment: Report given to RN ? ?Post vital signs: stable ? ?Last Vitals:  ?Vitals Value Taken Time  ?BP    ?Temp    ?Pulse 51 06/25/21 1136  ?Resp 9 06/25/21 1136  ?SpO2 99 % 06/25/21 1136  ?Vitals shown include unvalidated device data. ? ?Last Pain:  ?Vitals:  ? 06/25/21 0945  ?TempSrc: Temporal  ?PainSc: 2   ?   ? ?  ? ?Complications: No notable events documented. ?

## 2021-06-25 NOTE — Op Note (Signed)
Preoperative diagnosis:  ?Left mid ureteral calculus ? ?Postoperative diagnosis:  ?Same ? ?Procedure: ? ?Cystoscopy ?Left ureteroscopy and stone removal ?Ureteroscopic laser lithotripsy ?Left ureteral stent placement (24F/26 cm)  ?Left retrograde pyelography with interpretation ? ?Surgeon: Verna Czech. Baruc Tugwell, M.D. ? ?Anesthesia: General ? ?Complications: None ? ?Intraoperative findings:  ?Cystoscopy-urethra normal in caliber without stricture.  Prostate with mild lateral lobe enlargement and moderate bladder neck elevation with a small median lobe.  Bladder mucosa without erythema, solid or papillary lesions.  UOs normal-appearing bilaterally ?Left ureteroscopy-impacted stone left mid ureter with mucosal inflammatory changes ?Left retrograde pyelogram post procedure showed no extravasation ? ? ?EBL: Minimal ? ?Specimens: ?Calculus fragments for analysis ? ? ?Indication: Shawn Sheppard is a 51 y.o. with a 6 mm left mid ureteral calculus with intermittent renal colic.  After reviewing the management options for treatment, the patient elected to proceed with the above surgical procedure(s). We have discussed the potential benefits and risks of the procedure, side effects of the proposed treatment, the likelihood of the patient achieving the goals of the procedure, and any potential problems that might occur during the procedure or recuperation. Informed consent has been obtained. ? ?Description of procedure: ? ?The patient was taken to the operating room and general anesthesia was induced.  The patient was placed in the dorsal lithotomy position, prepped and draped in the usual sterile fashion, and preoperative antibiotics were administered. A preoperative time-out was performed.  ? ?A 21 French cystoscope was lubricated, passed per urethra and advanced proximally under direct vision with findings as described above. ? ?Attention was directed to the left ureteral orifice and a 0.038 Sensor wire was then advanced up the  ureter into the renal pelvis under fluoroscopic guidance.  Mild difficulty and advancing the wire passed the stone. ? ?A 4.5 Fr semirigid ureteroscope was then advanced into the ureter next to the guidewire and the calculus was identified as described above. ? ?The stone was then dusted with a 242 ?m holmium laser fiber on a setting of 0.3J/40 Hz ? ?All fragments were then removed from the ureter with a zero tip nitinol basket.  Reinspection of the ureter revealed no remaining visible stones or fragments.  ? ?Retrograde pyelogram was performed with findings as described above. ? ?A 24F/26 cm Contour ureteral stent was placed under fluoroscopic guidance.  The wire was then removed with an adequate stent curl noted in the renal pelvis as well as in the bladder. ? ?The bladder was then emptied and the procedure ended.  The patient appeared to tolerate the procedure well and without complications.  After anesthetic reversal the patient was transported to the PACU in stable condition.  ? ?Plan: ?Indwelling stent x7 days with office cystoscopy next week for cystoscopy with stent removal ? ? ?Shawn Axon, MD ? ?

## 2021-06-25 NOTE — Interval H&P Note (Signed)
History and Physical Interval Note: ? ?Initially seen 06/12/2021 for intermittent gross hematuria without symptoms.  Subsequently developed left flank pain 1 day after his office visit and was seen in the ED with CT showing a 5 mm left mid ureteral calculus.  He had continue with intermittent symptoms.  The calculus was not visualized on KUB and he elected ureteroscopic removal.  The procedure has been discussed in detail including potential risks of bleeding, infection and ureteral injury.  The need for a ureteral stent postoperatively was discussed and the possibility of stent pain/symptoms.  All questions were answered and he desires to proceed. ? ?CV: RRR ?Lungs: Clear ? ? ?06/25/2021 ?10:37 AM ? ?Shawn Sheppard  has presented today for surgery, with the diagnosis of Left Ureteral Stone.  The various methods of treatment have been discussed with the patient and family. After consideration of risks, benefits and other options for treatment, the patient has consented to  Procedure(s): ?CYSTOSCOPY/URETEROSCOPY/HOLMIUM LASER/STENT PLACEMENT (Left) as a surgical intervention.  The patient's history has been reviewed, patient examined, no change in status, stable for surgery.  I have reviewed the patient's chart and labs.  Questions were answered to the patient's satisfaction.   ? ? ?[name] C Angeline Trick ? ? ?

## 2021-06-25 NOTE — Discharge Instructions (Addendum)
DISCHARGE INSTRUCTIONS FOR KIDNEY STONE/URETERAL STENT  ? ?MEDICATIONS:  ?1. Resume all your other meds from home.  ?2.  AZO (over-the-counter) can help with the burning/stinging when you urinate. ?3.  Hydrocodone is for moderate/severe pain, Rx was sent to your pharmacy. ? ?ACTIVITY:  ?1. May resume regular activities in 24 hours. ?2. No driving while on narcotic pain medications  ?3. Drink plenty of water  ?4. Continue to walk at home - you can still get blood clots when you are at home, so keep active, but don't over do it.  ?5. May return to work/school tomorrow or when you feel ready  ? ?BATHING:  ?1. You can shower. ?2. You May see a string coming from your urethra: The stent string is attached to your ureteral stent. Do not pull on this.  ? ?SIGNS/SYMPTOMS TO CALL:  ?Common postoperative symptoms include urinary frequency, urgency, bladder spasm and blood in the urine ? ?Please call us if you have a fever greater than 101.5, uncontrolled nausea/vomiting, uncontrolled pain, dizziness, unable to urinate, excessively bloody urine, chest pain, shortness of breath, leg swelling, leg pain, or any other concerns or questions.  ? ?You can reach [place] at [phone].  ? ?FOLLOW-UP:  ?1. You we will be contacted for an appointment for stent removal to be scheduled next week ? ?AMBULATORY SURGERY  ?DISCHARGE INSTRUCTIONS ? ? ?The drugs that you were given will stay in your system until tomorrow so for the next 24 hours you should not: ? ?Drive an automobile ?Make any legal decisions ?Drink any alcoholic beverage ? ? ?You may resume regular meals tomorrow.  Today it is better to start with liquids and gradually work up to solid foods. ? ?You may eat anything you prefer, but it is better to start with liquids, then soup and crackers, and gradually work up to solid foods. ? ? ?Please notify your doctor immediately if you have any unusual bleeding, trouble breathing, redness and pain at the surgery site, drainage, fever, or  pain not relieved by medication. ? ? ? ?Additional Instructions: ? ?Please contact your physician with any problems or Same Day Surgery at [phone], Monday through Friday 6 am to 4 pm, or  at [facility] number at [phone].  ?

## 2021-07-02 LAB — CALCULI, WITH PHOTOGRAPH (CLINICAL LAB)
Calcium Oxalate Dihydrate: 70 %
Calcium Oxalate Monohydrate: 30 %
Weight Calculi: 3 mg

## 2021-07-04 ENCOUNTER — Encounter: Payer: Self-pay | Admitting: Urology

## 2021-07-04 ENCOUNTER — Ambulatory Visit (INDEPENDENT_AMBULATORY_CARE_PROVIDER_SITE_OTHER): Payer: BC Managed Care – PPO | Admitting: Urology

## 2021-07-04 VITALS — BP 124/77 | HR 60 | Ht 70.0 in | Wt 243.0 lb

## 2021-07-04 DIAGNOSIS — N200 Calculus of kidney: Secondary | ICD-10-CM | POA: Diagnosis not present

## 2021-07-04 LAB — URINALYSIS, COMPLETE
Bilirubin, UA: NEGATIVE
Glucose, UA: NEGATIVE
Ketones, UA: NEGATIVE
Nitrite, UA: NEGATIVE
Specific Gravity, UA: 1.015 (ref 1.005–1.030)
Urobilinogen, Ur: 0.2 mg/dL (ref 0.2–1.0)
pH, UA: 5.5 (ref 5.0–7.5)

## 2021-07-04 LAB — MICROSCOPIC EXAMINATION

## 2021-07-04 MED ORDER — LEVOFLOXACIN 500 MG PO TABS
500.0000 mg | ORAL_TABLET | Freq: Once | ORAL | Status: AC
  Administered 2021-07-04: 500 mg via ORAL

## 2021-07-04 NOTE — Patient Instructions (Signed)
[facility] Instructions ?[facility] ?  ?You will receive a box/kit in the mail that will have a urine jug and instructions in the kit.  When the box arrives you will need to call our office [phone] to schedule a LAB appointment. ?  ?You will need to do a 24hour urine and this should be done during the days that our office will be open.  For example any day from Sunday through Thursday. ?  ?If you take Vitamin C 186m or greater please stop this 5 days prior to collection. ?  ?How to collect the urine sample: On the day you start the urine sample this 1st morning urine should NOT be collected.  For the rest of the day including all night urines should be collected.  On the next morning the 1st urine should be collected and then you will be finished with the urine collections. ?  ?You will need to bring the box with you on your LAB appointment day after urine has been collected and all instructions are complete in the box.  Your blood will be drawn and the box will be collected by our Lab employee to be sent off for analysis. ?  ?When urine and blood is complete you will need to schedule a follow up appointment for lab results.  ?

## 2021-07-04 NOTE — Progress Notes (Addendum)
Indications: Patient is 51 y.o., who is s/p ureteroscopic removal of an impacted right mid ureteral calculus 06/25/2021.  He had moderate stent symptoms postoperatively.  The patient is presenting today for stent removal. ? ?Procedure:  Flexible Cystoscopy with stent removal (10272) ? ?Timeout was performed and the correct patient, procedure and participants were identified.   ? ?Description:  The patient was prepped and draped in the usual sterile fashion. Flexible cystosopy was performed.  The stent was visualized, grasped, however the stent released from the grasper and the distal urethra.  Approximately 3 attempts at regrasped in the stent were unsuccessful.  A new stent grasper was utilized and the stent was successfully removed.  A single dose of oral antibiotics was given. ? ?Complications:  None ? ?Plan:  ?Stone analysis CaOxMono/CaOxDi 30/70 was discussed ?We discussed metabolic evaluation versus general stone prevention guidelines and he has elected to proceed with a metabolic evaluation ?24-hour urine and blood work via [facility]-would wait approximately 1 month prior to urine collection ?Will contact with results ? ? ?Shawn Axon, MD ? ?

## 2021-07-18 ENCOUNTER — Other Ambulatory Visit: Payer: BC Managed Care – PPO | Admitting: Urology

## 2021-08-06 ENCOUNTER — Other Ambulatory Visit: Payer: Self-pay | Admitting: Urology

## 2022-09-02 ENCOUNTER — Other Ambulatory Visit: Payer: Self-pay | Admitting: Physician Assistant

## 2022-09-02 ENCOUNTER — Other Ambulatory Visit: Payer: Self-pay | Admitting: Family Medicine

## 2022-09-02 DIAGNOSIS — N200 Calculus of kidney: Secondary | ICD-10-CM

## 2022-09-02 MED ORDER — TAMSULOSIN HCL 0.4 MG PO CAPS: 0.4000 mg | ORAL_CAPSULE | Freq: Every day | ORAL | 0 refills | Status: DC

## 2022-09-03 ENCOUNTER — Ambulatory Visit
Admission: RE | Admit: 2022-09-03 | Discharge: 2022-09-03 | Disposition: A | Discharge status: Home / Self Care | Payer: BC Managed Care – PPO | Attending: Physician Assistant | Admitting: Physician Assistant

## 2022-09-03 ENCOUNTER — Ambulatory Visit: Payer: BC Managed Care – PPO | Admitting: Physician Assistant

## 2022-09-03 ENCOUNTER — Other Ambulatory Visit: Payer: Self-pay | Admitting: Physician Assistant

## 2022-09-03 ENCOUNTER — Encounter: Payer: Self-pay | Admitting: Physician Assistant

## 2022-09-03 ENCOUNTER — Ambulatory Visit
Admission: RE | Admit: 2022-09-03 | Discharge: 2022-09-03 | Disposition: A | Discharge status: Home / Self Care | Payer: BC Managed Care – PPO | Source: Ambulatory Visit | Attending: Physician Assistant | Admitting: Physician Assistant

## 2022-09-03 VITALS — BP 132/80 | HR 74 | Ht 70.0 in | Wt 245.0 lb

## 2022-09-03 DIAGNOSIS — N200 Calculus of kidney: Secondary | ICD-10-CM | POA: Insufficient documentation

## 2022-09-03 DIAGNOSIS — Z87442 Personal history of urinary calculi: Secondary | ICD-10-CM | POA: Insufficient documentation

## 2022-09-03 DIAGNOSIS — R109 Unspecified abdominal pain: Secondary | ICD-10-CM | POA: Insufficient documentation

## 2022-09-03 DIAGNOSIS — N201 Calculus of ureter: Secondary | ICD-10-CM

## 2022-09-03 LAB — URINALYSIS, COMPLETE
Bilirubin, UA: NEGATIVE
Glucose, UA: NEGATIVE
Ketones, UA: NEGATIVE
Nitrite, UA: NEGATIVE
Specific Gravity, UA: 1.02 (ref 1.005–1.030)
Urobilinogen, Ur: 1 mg/dL (ref 0.2–1.0)
pH, UA: 5.5 (ref 5.0–7.5)

## 2022-09-03 LAB — MICROSCOPIC EXAMINATION: RBC, Urine: >30 /HPF — AB (ref 0–2)

## 2022-09-03 MED ORDER — ONDANSETRON 4 MG PO TBDP
4.0000 mg | ORAL_TABLET | Freq: Three times a day (TID) | ORAL | 0 refills | Status: DC | PRN
Start: 2022-09-03 — End: 2023-06-18

## 2022-09-03 MED ORDER — OXYCODONE-ACETAMINOPHEN 5-325 MG PO TABS
1.0000 | ORAL_TABLET | Freq: Four times a day (QID) | ORAL | 0 refills | Status: AC | PRN
Start: 2022-09-03 — End: 2022-09-08

## 2022-09-03 NOTE — Progress Notes (Signed)
[facility] ORDER FORM  Expected date of procedure: 09/04/2022  Surgeon: Legrand Rams, MD  Post op standing: 2-4wk follow up w/KUB prior  Anticoagulation/Aspirin/NSAID standing order: Hold all 72 hours prior  Anesthesia standing order: MAC  VTE standing: SCD's  Dx: Right Ureteral Stone  Procedure: right Extracorporeal shock wave lithotripsy  CPT : 50590  Standing Order Set:   *NPO after mn, KUB  *NS 154ml/hr, Keflex 500mg  PO, Benadryl 25mg  PO, Valium 10mg  PO, Zofran 4mg  IV    Medications if other than standing orders:   NONE

## 2022-09-03 NOTE — H&P (View-Only) (Signed)
09/03/2022 12:52 PM   Shawn Sheppard 1971/03/05 [id number]  CC: Chief Complaint  Patient presents with   Nephrolithiasis   HPI: Shawn Sheppard is a 52 y.o. 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   Today he reports onset of dark, discolored urine 1 to 2 weeks ago followed by intermittent burning sensations at the tip of his penis.  He has had intermittent, severe right flank pain, vomiting, and RLQ pain for the past 2 days.  He denies fevers and is comfortable today in clinic.  KUB today with 2 stable appearing left phleboliths and a faint calcification in the right hemipelvis that may represent a distal ureteral stone.  He was sent from clinic today for a stat CT stone study, which confirmed a 5 mm distal right ureteral stone (<500HU density, ~12.5cm skin-to-stone distance).  No significant hydronephrosis noted.  No left ureteral stones.  In-office UA today positive for 3+ blood, 2+ protein, and 1+ leukocytes; urine microscopy with 6-10 WBCs/HPF, >30 RBCs/HPF, and moderate bacteria.  PMH: Past Medical History:  Diagnosis Date   Anxiety    Hypothyroidism    Thyroid disease     Surgical History: Past Surgical History:  Procedure Laterality Date   CYSTOSCOPY W/ RETROGRADES Left 06/25/2021   Procedure: CYSTOSCOPY WITH RETROGRADE PYELOGRAM;  Surgeon: Riki Altes, MD;  Location: [facility];  Service: Urology;  Laterality: Left;   CYSTOSCOPY/URETEROSCOPY/HOLMIUM LASER/STENT PLACEMENT Left 06/25/2021   Procedure: CYSTOSCOPY/URETEROSCOPY/HOLMIUM LASER/STONE EXTRACTION/STENT PLACEMENT;  Surgeon: Riki Altes, MD;  Location: [facility];  Service: Urology;  Laterality: Left;   DISTAL BICEPS TENDON REPAIR Right 03/08/2020   Procedure: DISTAL BICEPS TENDON REPAIR;  Surgeon: Christena Flake, MD;  Location: [facility];  Service:  Orthopedics;  Laterality: Right;   TESTICLE TORSION REDUCTION Left 2006   VASECTOMY      Home Medications:  Allergies as of 09/03/2022       Reactions   Sulfa Antibiotics Other (See Comments)   blisters   Augmentin [amoxicillin-pot Clavulanate] Rash   Penicillins Rash   Tramadol Rash        Medication List        Accurate as of Sep 03, 2022 12:52 PM. If you have any questions, ask your nurse or doctor.          acyclovir 400 MG tablet Commonly known as: ZOVIRAX Take 400 mg by mouth 3 (three) times daily as needed (for cold sore).   diazepam 5 MG tablet Commonly known as: VALIUM Take 5 mg by mouth every 12 (twelve) hours as needed for anxiety.   DULoxetine 60 MG capsule Commonly known as: CYMBALTA Take 60 mg by mouth daily.   levothyroxine 50 MCG tablet Commonly known as: SYNTHROID Take 50 mcg by mouth daily before breakfast.   ondansetron 4 MG disintegrating tablet Commonly known as: ZOFRAN-ODT Take 1 tablet (4 mg total) by mouth every 8 (eight) hours as needed for nausea or vomiting.   oxybutynin 5 MG tablet Commonly known as: DITROPAN 1 tab tid prn frequency,urgency, bladder spasm   tamsulosin 0.4 MG Caps capsule Commonly known as: FLOMAX Take 1 capsule (0.4 mg total) by mouth daily.        Allergies:  Allergies  Allergen Reactions   Sulfa Antibiotics Other (See Comments)    blisters   Augmentin [Amoxicillin-Pot Clavulanate] Rash   Penicillins Rash  Tramadol Rash    Family History: No family history on file.  Social History:   reports that he has never smoked. He has never used smokeless tobacco. He reports that he does not drink alcohol and does not use drugs.  Physical Exam: BP 132/80   Pulse 74   Ht 5\' 10"  (1.778 m)   Wt 245 lb (111.1 kg)   BMI 35.15 kg/m   Constitutional:  Alert and oriented, no acute distress, nontoxic appearing HEENT: , AT Cardiovascular: No clubbing, cyanosis, or edema Respiratory: Normal respiratory  effort, no increased work of breathing Skin: No rashes, bruises or suspicious lesions Neurologic: Grossly intact, no focal deficits, moving all 4 extremities Psychiatric: Normal mood and affect  Laboratory Data: Results for orders placed or performed in visit on 09/03/22  Microscopic Examination   Urine  Result Value Ref Range   WBC, UA 6-10 (A) 0 - 5 /hpf   RBC, Urine >30 (A) 0 - 2 /hpf   Epithelial Cells (non renal) 0-10 0 - 10 /hpf   Casts Present (A) None seen /lpf   Cast Type Hyaline casts N/A   Mucus, UA Present (A) Not Estab.   Bacteria, UA Moderate (A) None seen/Few  Urinalysis, Complete  Result Value Ref Range   Specific Gravity, UA 1.020 1.005 - 1.030   pH, UA 5.5 5.0 - 7.5   Color, UA Amber (A) Yellow   Appearance Ur Hazy (A) Clear   Leukocytes,UA 1+ (A) Negative   Protein,UA 2+ (A) Negative/Trace   Glucose, UA Negative Negative   Ketones, UA Negative Negative   RBC, UA 3+ (A) Negative   Bilirubin, UA Negative Negative   Urobilinogen, Ur 1.0 0.2 - 1.0 mg/dL   Nitrite, UA Negative Negative   Microscopic Examination See below:    Pertinent Imaging: KUB, 09/03/2022: See Epic  CT stone study, 09/03/2022: CLINICAL DATA:  Flank pain, gross hematuria.   EXAM: CT ABDOMEN AND PELVIS WITHOUT CONTRAST   TECHNIQUE: Multidetector CT imaging of the abdomen and pelvis was performed following the standard protocol without IV contrast.   RADIATION DOSE REDUCTION: This exam was performed according to the departmental dose-optimization program which includes automated exposure control, adjustment of the mA and/or kV according to patient size and/or use of iterative reconstruction technique.   COMPARISON:  June 13, 2021.   FINDINGS: Lower chest: No acute abnormality.   Hepatobiliary: Hepatic steatosis. No cholelithiasis or biliary dilatation.   Pancreas: Unremarkable. No pancreatic ductal dilatation or surrounding inflammatory changes.   Spleen: Normal in size  without focal abnormality.   Adrenals/Urinary Tract: Adrenal glands appear normal. Small bilateral nonobstructive renal calculi are noted. Minimal right ureteral dilatation is noted secondary to 5 mm calculus in distal right ureter. Urinary bladder is unremarkable.   Stomach/Bowel: Stomach is within normal limits. Appendix appears normal. No evidence of bowel wall thickening, distention, or inflammatory changes. Sigmoid diverticulosis is noted without inflammation.   Vascular/Lymphatic: No significant vascular findings are present. No enlarged abdominal or pelvic lymph nodes.   Reproductive: Prostate is unremarkable.   Other: No abdominal wall hernia or abnormality. No abdominopelvic ascites.   Musculoskeletal: No acute or significant osseous findings.   IMPRESSION: Bilateral nonobstructive nephrolithiasis. Minimal right ureteral dilatation is noted secondary to 5 mm calculus in distal right ureter.   Hepatic steatosis.   Sigmoid diverticulosis without inflammation.     Electronically Signed   By: Lupita Raider M.D.   On: 09/03/2022 15:56  I personally reviewed the images referenced  above and note a 5 mm distal right ureteral stone.  Assessment & Plan:   1. Right ureteral stone Renal colic secondary to a 5 mm distal right ureteral stone.  He is comfortable and afebrile in clinic today, low suspicion for UTI at this time.  We discussed various treatment options for his stone including trial of passage vs. ESWL vs. ureteroscopy with laser lithotripsy and stent.   We specifically discussed that ESWL is a less invasive procedure that requires less anesthesia, however patients have to pass their residual stone fragments, which may take several weeks and be associated with continued renal colic. Additionally, we discussed the limitations of ESWL including the low, 10-20% chance of treatment failure requiring repeat ESWL versus ureteroscopy in the future. By comparison,  ureteroscopy is a more invasive surgical procedure that requires more anesthesia. However, URS does require placement of a ureteral stent, which will remain in place for approximately 3-10 days and can be associated with flank pain, bladder pain, dysuria, urgency, frequency, urinary leakage, and gross hematuria.  Based on this conversation, he would like to proceed with ESWL. Will book him for tomorrow with Dr. Richardo Hanks. Sending in Percocet and Zofran for symptom control in the interim. We discussed return precautions including fever, uncontrollable pain, and uncontrollable nausea/vomiting. - Urinalysis, Complete - CULTURE, URINE COMPREHENSIVE - CT RENAL STONE STUDY; Future - oxyCODONE-acetaminophen (PERCOCET/ROXICET) 5-325 MG tablet; Take 1-2 tablets by mouth every 6 (six) hours as needed for up to 5 days for severe pain.  Dispense: 10 tablet; Refill: 0 - ondansetron (ZOFRAN-ODT) 4 MG disintegrating tablet; Take 1 tablet (4 mg total) by mouth every 8 (eight) hours as needed for nausea or vomiting.  Dispense: 20 tablet; Refill: 0   Return in 1 day (on 09/04/2022) for Right ESWL with Dr. Richardo Hanks.  Carman Ching, PA-C  [facility] [place] [address] [place], [place] [address] [phone]

## 2022-09-03 NOTE — Progress Notes (Signed)
 09/03/2022 12:52 PM   Shawn Sheppard 12/05/1970 [id number]  CC: Chief Complaint  Patient presents with   Nephrolithiasis   HPI: Shawn Sheppard is a 51 y.o. 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   Today he reports onset of dark, discolored urine 1 to 2 weeks ago followed by intermittent burning sensations at the tip of his penis.  He has had intermittent, severe right flank pain, vomiting, and RLQ pain for the past 2 days.  He denies fevers and is comfortable today in clinic.  KUB today with 2 stable appearing left phleboliths and a faint calcification in the right hemipelvis that may represent a distal ureteral stone.  He was sent from clinic today for a stat CT stone study, which confirmed a 5 mm distal right ureteral stone (<500HU density, ~12.5cm skin-to-stone distance).  No significant hydronephrosis noted.  No left ureteral stones.  In-office UA today positive for 3+ blood, 2+ protein, and 1+ leukocytes; urine microscopy with 6-10 WBCs/HPF, >30 RBCs/HPF, and moderate bacteria.  PMH: Past Medical History:  Diagnosis Date   Anxiety    Hypothyroidism    Thyroid disease     Surgical History: Past Surgical History:  Procedure Laterality Date   CYSTOSCOPY W/ RETROGRADES Left 06/25/2021   Procedure: CYSTOSCOPY WITH RETROGRADE PYELOGRAM;  Surgeon: Stoioff, Scott C, MD;  Location: [facility];  Service: Urology;  Laterality: Left;   CYSTOSCOPY/URETEROSCOPY/HOLMIUM LASER/STENT PLACEMENT Left 06/25/2021   Procedure: CYSTOSCOPY/URETEROSCOPY/HOLMIUM LASER/STONE EXTRACTION/STENT PLACEMENT;  Surgeon: Stoioff, Scott C, MD;  Location: [facility];  Service: Urology;  Laterality: Left;   DISTAL BICEPS TENDON REPAIR Right 03/08/2020   Procedure: DISTAL BICEPS TENDON REPAIR;  Surgeon: Poggi, John J, MD;  Location: [facility];  Service:  Orthopedics;  Laterality: Right;   TESTICLE TORSION REDUCTION Left 2006   VASECTOMY      Home Medications:  Allergies as of 09/03/2022       Reactions   Sulfa Antibiotics Other (See Comments)   blisters   Augmentin [amoxicillin-pot Clavulanate] Rash   Penicillins Rash   Tramadol Rash        Medication List        Accurate as of Sep 03, 2022 12:52 PM. If you have any questions, ask your nurse or doctor.          acyclovir 400 MG tablet Commonly known as: ZOVIRAX Take 400 mg by mouth 3 (three) times daily as needed (for cold sore).   diazepam 5 MG tablet Commonly known as: VALIUM Take 5 mg by mouth every 12 (twelve) hours as needed for anxiety.   DULoxetine 60 MG capsule Commonly known as: CYMBALTA Take 60 mg by mouth daily.   levothyroxine 50 MCG tablet Commonly known as: SYNTHROID Take 50 mcg by mouth daily before breakfast.   ondansetron 4 MG disintegrating tablet Commonly known as: ZOFRAN-ODT Take 1 tablet (4 mg total) by mouth every 8 (eight) hours as needed for nausea or vomiting.   oxybutynin 5 MG tablet Commonly known as: DITROPAN 1 tab tid prn frequency,urgency, bladder spasm   tamsulosin 0.4 MG Caps capsule Commonly known as: FLOMAX Take 1 capsule (0.4 mg total) by mouth daily.        Allergies:  Allergies  Allergen Reactions   Sulfa Antibiotics Other (See Comments)    blisters   Augmentin [Amoxicillin-Pot Clavulanate] Rash   Penicillins Rash     Tramadol Rash    Family History: No family history on file.  Social History:   reports that he has never smoked. He has never used smokeless tobacco. He reports that he does not drink alcohol and does not use drugs.  Physical Exam: BP 132/80   Pulse 74   Ht 5' 10" (1.778 m)   Wt 245 lb (111.1 kg)   BMI 35.15 kg/m   Constitutional:  Alert and oriented, no acute distress, nontoxic appearing HEENT: [place], AT Cardiovascular: No clubbing, cyanosis, or edema Respiratory: Normal respiratory  effort, no increased work of breathing Skin: No rashes, bruises or suspicious lesions Neurologic: Grossly intact, no focal deficits, moving all 4 extremities Psychiatric: Normal mood and affect  Laboratory Data: Results for orders placed or performed in visit on 09/03/22  Microscopic Examination   Urine  Result Value Ref Range   WBC, UA 6-10 (A) 0 - 5 /hpf   RBC, Urine >30 (A) 0 - 2 /hpf   Epithelial Cells (non renal) 0-10 0 - 10 /hpf   Casts Present (A) None seen /lpf   Cast Type Hyaline casts N/A   Mucus, UA Present (A) Not Estab.   Bacteria, UA Moderate (A) None seen/Few  Urinalysis, Complete  Result Value Ref Range   Specific Gravity, UA 1.020 1.005 - 1.030   pH, UA 5.5 5.0 - 7.5   Color, UA Amber (A) Yellow   Appearance Ur Hazy (A) Clear   Leukocytes,UA 1+ (A) Negative   Protein,UA 2+ (A) Negative/Trace   Glucose, UA Negative Negative   Ketones, UA Negative Negative   RBC, UA 3+ (A) Negative   Bilirubin, UA Negative Negative   Urobilinogen, Ur 1.0 0.2 - 1.0 mg/dL   Nitrite, UA Negative Negative   Microscopic Examination See below:    Pertinent Imaging: KUB, 09/03/2022: See Epic  CT stone study, 09/03/2022: CLINICAL DATA:  Flank pain, gross hematuria.   EXAM: CT ABDOMEN AND PELVIS WITHOUT CONTRAST   TECHNIQUE: Multidetector CT imaging of the abdomen and pelvis was performed following the standard protocol without IV contrast.   RADIATION DOSE REDUCTION: This exam was performed according to the departmental dose-optimization program which includes automated exposure control, adjustment of the mA and/or kV according to patient size and/or use of iterative reconstruction technique.   COMPARISON:  June 13, 2021.   FINDINGS: Lower chest: No acute abnormality.   Hepatobiliary: Hepatic steatosis. No cholelithiasis or biliary dilatation.   Pancreas: Unremarkable. No pancreatic ductal dilatation or surrounding inflammatory changes.   Spleen: Normal in size  without focal abnormality.   Adrenals/Urinary Tract: Adrenal glands appear normal. Small bilateral nonobstructive renal calculi are noted. Minimal right ureteral dilatation is noted secondary to 5 mm calculus in distal right ureter. Urinary bladder is unremarkable.   Stomach/Bowel: Stomach is within normal limits. Appendix appears normal. No evidence of bowel wall thickening, distention, or inflammatory changes. Sigmoid diverticulosis is noted without inflammation.   Vascular/Lymphatic: No significant vascular findings are present. No enlarged abdominal or pelvic lymph nodes.   Reproductive: Prostate is unremarkable.   Other: No abdominal wall hernia or abnormality. No abdominopelvic ascites.   Musculoskeletal: No acute or significant osseous findings.   IMPRESSION: Bilateral nonobstructive nephrolithiasis. Minimal right ureteral dilatation is noted secondary to 5 mm calculus in distal right ureter.   Hepatic steatosis.   Sigmoid diverticulosis without inflammation.     Electronically Signed   By: James  Green Jr M.D.   On: 09/03/2022 15:56  I personally reviewed the images referenced   above and note a 5 mm distal right ureteral stone.  Assessment & Plan:   1. Right ureteral stone Renal colic secondary to a 5 mm distal right ureteral stone.  He is comfortable and afebrile in clinic today, low suspicion for UTI at this time.  We discussed various treatment options for his stone including trial of passage vs. ESWL vs. ureteroscopy with laser lithotripsy and stent.   We specifically discussed that ESWL is a less invasive procedure that requires less anesthesia, however patients have to pass their residual stone fragments, which may take several weeks and be associated with continued renal colic. Additionally, we discussed the limitations of ESWL including the low, 10-20% chance of treatment failure requiring repeat ESWL versus ureteroscopy in the future. By comparison,  ureteroscopy is a more invasive surgical procedure that requires more anesthesia. However, URS does require placement of a ureteral stent, which will remain in place for approximately 3-10 days and can be associated with flank pain, bladder pain, dysuria, urgency, frequency, urinary leakage, and gross hematuria.  Based on this conversation, he would like to proceed with ESWL. Will book him for tomorrow with Dr. Sninsky. Sending in Percocet and Zofran for symptom control in the interim. We discussed return precautions including fever, uncontrollable pain, and uncontrollable nausea/vomiting. - Urinalysis, Complete - CULTURE, URINE COMPREHENSIVE - CT RENAL STONE STUDY; Future - oxyCODONE-acetaminophen (PERCOCET/ROXICET) 5-325 MG tablet; Take 1-2 tablets by mouth every 6 (six) hours as needed for up to 5 days for severe pain.  Dispense: 10 tablet; Refill: 0 - ondansetron (ZOFRAN-ODT) 4 MG disintegrating tablet; Take 1 tablet (4 mg total) by mouth every 8 (eight) hours as needed for nausea or vomiting.  Dispense: 20 tablet; Refill: 0   Return in 1 day (on 09/04/2022) for Right ESWL with Dr. Sninsky.  Murial Beam, PA-C  [facility] [place] [address], [address] , [place] [address] [phone]    

## 2022-09-04 ENCOUNTER — Encounter: Payer: Self-pay | Admitting: Certified Registered"

## 2022-09-04 ENCOUNTER — Other Ambulatory Visit: Payer: Self-pay

## 2022-09-04 ENCOUNTER — Ambulatory Visit
Admission: RE | Admit: 2022-09-04 | Discharge: 2022-09-04 | Disposition: A | Discharge status: Home / Self Care | Payer: BC Managed Care – PPO | Attending: Urology | Admitting: Urology

## 2022-09-04 ENCOUNTER — Encounter: Payer: Self-pay | Admitting: Urology

## 2022-09-04 ENCOUNTER — Encounter
Admission: RE | Disposition: A | Discharge status: Home / Self Care | Payer: Self-pay | Source: Home / Self Care | Attending: Urology

## 2022-09-04 ENCOUNTER — Ambulatory Visit: Payer: BC Managed Care – PPO

## 2022-09-04 DIAGNOSIS — N201 Calculus of ureter: Secondary | ICD-10-CM | POA: Diagnosis present

## 2022-09-04 DIAGNOSIS — Z6835 Body mass index (BMI) 35.0-35.9, adult: Secondary | ICD-10-CM | POA: Diagnosis not present

## 2022-09-04 DIAGNOSIS — E669 Obesity, unspecified: Secondary | ICD-10-CM | POA: Diagnosis not present

## 2022-09-04 DIAGNOSIS — G4730 Sleep apnea, unspecified: Secondary | ICD-10-CM | POA: Insufficient documentation

## 2022-09-04 HISTORY — PX: EXTRACORPOREAL SHOCK WAVE LITHOTRIPSY: SHX1557

## 2022-09-04 SURGERY — LITHOTRIPSY, ESWL
Anesthesia: Moderate Sedation | Laterality: Right

## 2022-09-04 MED ORDER — ONDANSETRON HCL 4 MG/2ML IJ SOLN
4.0000 mg | Freq: Once | INTRAMUSCULAR | Status: AC
  Administered 2022-09-04: 4 mg via INTRAVENOUS

## 2022-09-04 MED ORDER — CEPHALEXIN 500 MG PO CAPS
ORAL_CAPSULE | ORAL | Status: AC
  Filled 2022-09-04: qty 1

## 2022-09-04 MED ORDER — DIPHENHYDRAMINE HCL 25 MG PO CAPS
25.0000 mg | ORAL_CAPSULE | ORAL | Status: AC
  Administered 2022-09-04: 25 mg via ORAL

## 2022-09-04 MED ORDER — ONDANSETRON HCL 4 MG/2ML IJ SOLN
INTRAMUSCULAR | Status: AC
  Filled 2022-09-04: qty 2

## 2022-09-04 MED ORDER — DIAZEPAM 5 MG PO TABS
ORAL_TABLET | ORAL | Status: AC
  Filled 2022-09-04: qty 2

## 2022-09-04 MED ORDER — DIAZEPAM 5 MG PO TABS
10.0000 mg | ORAL_TABLET | ORAL | Status: AC
  Administered 2022-09-04: 10 mg via ORAL

## 2022-09-04 MED ORDER — DIPHENHYDRAMINE HCL 25 MG PO CAPS
ORAL_CAPSULE | ORAL | Status: AC
  Filled 2022-09-04: qty 1

## 2022-09-04 MED ORDER — SODIUM CHLORIDE 0.9 % IV SOLN: INTRAVENOUS | Status: DC

## 2022-09-04 MED ORDER — CEPHALEXIN 500 MG PO CAPS
500.0000 mg | ORAL_CAPSULE | Freq: Once | ORAL | Status: AC
  Administered 2022-09-04: 500 mg via ORAL

## 2022-09-04 NOTE — Interval H&P Note (Signed)
UROLOGY H&P UPDATE  Agree with prior H&P dated 09/03/22, 5mm right distal ureteral stone, no clinical evidence of infection/UTI.  Cardiac: RRR Lungs: CTA bilaterally  Laterality: RIGHT Procedure: right shockwave lithotripsy  Informed consent obtained, we specifically discussed the risks of bleeding/hematoma, infection, post-operative pain/obstructive fragments/renal colic, need for additional procedures including ureteroscopy.  Sondra Come, MD 09/04/2022

## 2022-09-04 NOTE — Brief Op Note (Signed)
09/04/2022  9:37 AM  PATIENT:  Shawn Sheppard  52 y.o. male  PRE-OPERATIVE DIAGNOSIS:  5mm Right Distal Ureteral Stone  POST-OPERATIVE DIAGNOSIS:  Same  PROCEDURE:  Procedure(s): EXTRACORPOREAL SHOCK WAVE LITHOTRIPSY (ESWL) (Right)  SURGEON:  Surgeon(s) and Role:    * Sondra Come, MD - Primary  ANESTHESIA: Conscious Sedation  EBL:  None  Drains: None  Specimen: None  Findings:  Tolerated SWL well, stone appeared to smudge on flouroscopy  DISPO: Flomax, pain meds PRN, RTC 2 weeks KUB  Legrand Rams, MD 09/04/2022

## 2022-09-05 ENCOUNTER — Encounter: Payer: Self-pay | Admitting: Urology

## 2022-09-09 LAB — CULTURE, URINE COMPREHENSIVE

## 2022-09-24 ENCOUNTER — Other Ambulatory Visit: Payer: Self-pay

## 2022-09-24 DIAGNOSIS — N201 Calculus of ureter: Secondary | ICD-10-CM

## 2022-09-25 ENCOUNTER — Ambulatory Visit: Payer: BC Managed Care – PPO | Admitting: Physician Assistant

## 2022-09-28 IMAGING — CT CT RENAL STONE PROTOCOL
2 of 4 series · 16 of 46 positions shown, 18 images · non-contrast
Comparison: CT stone study 07/07/2020

CLINICAL DATA: Left-sided flank pain.  Hematuria.



[Series 2: ap without · axial · non-contrast · 0.85mm/px · z∈[-1113,-658]mm · 13 of 105 slices shown, 15 images]
[im 7/105  soft-tissue]
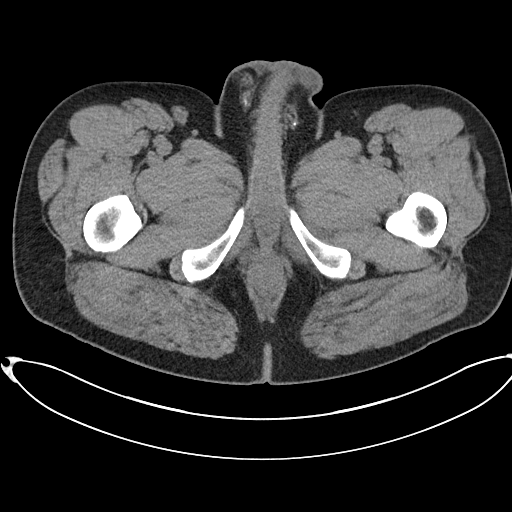
[im 7/105  bone]
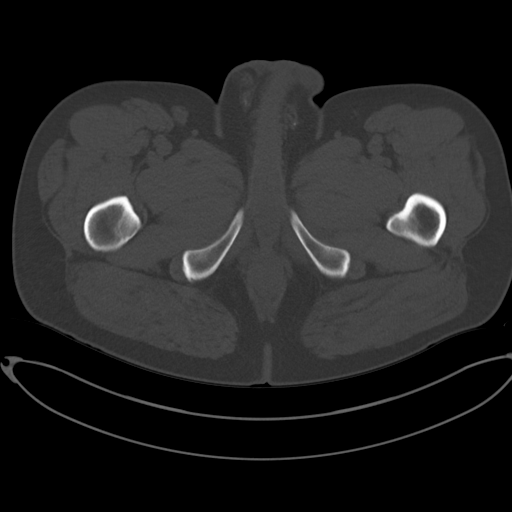
[im 13/105  soft-tissue]
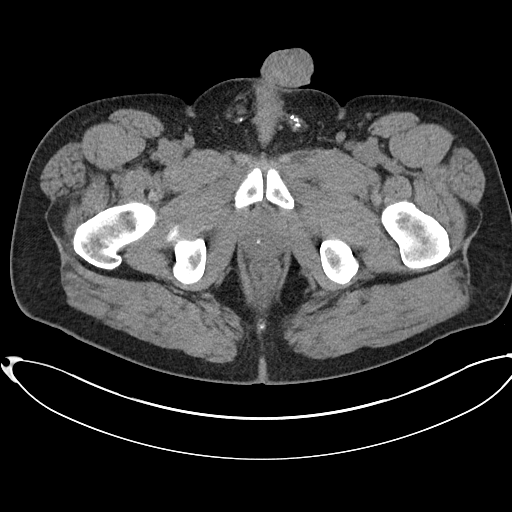
[im 25/105  soft-tissue]
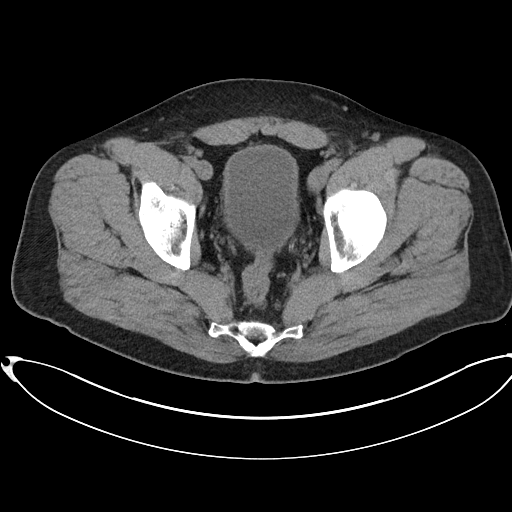
[im 31/105  soft-tissue]
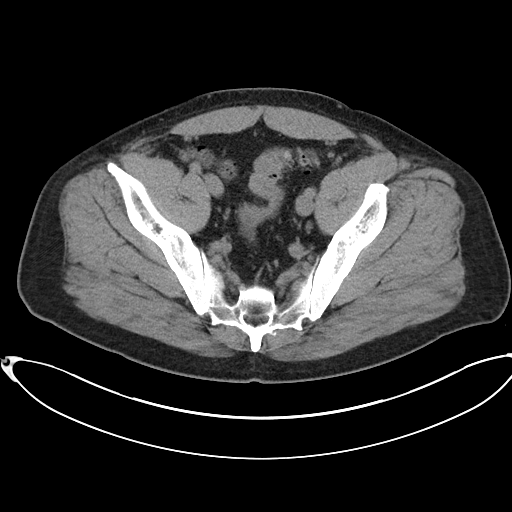
[im 37/105  soft-tissue]
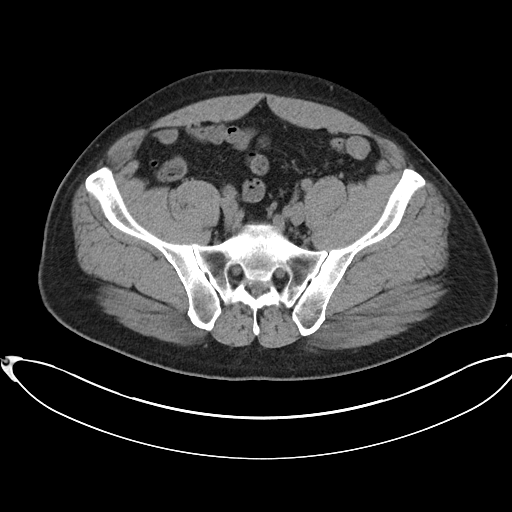
[im 43/105  soft-tissue]
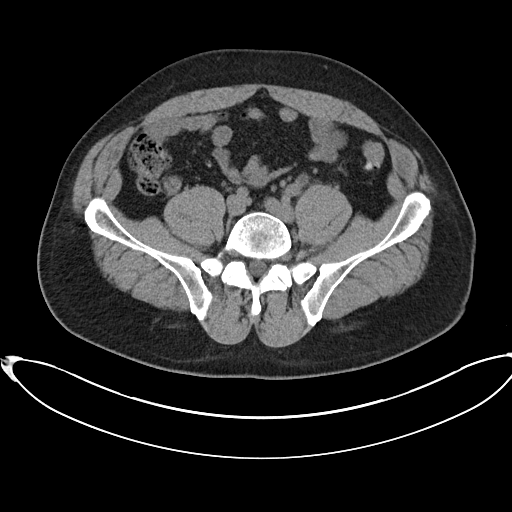
[im 56/105  soft-tissue]
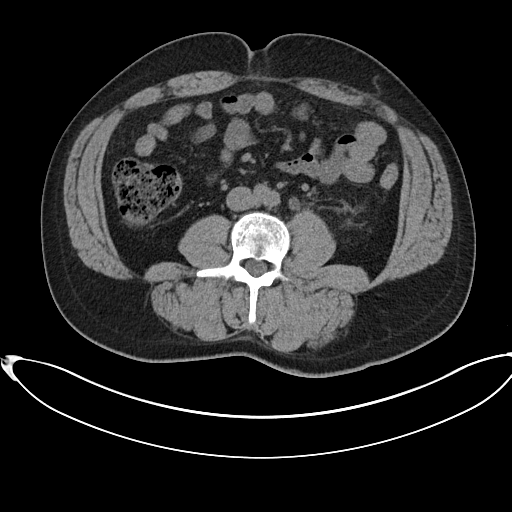
[im 62/105  soft-tissue]
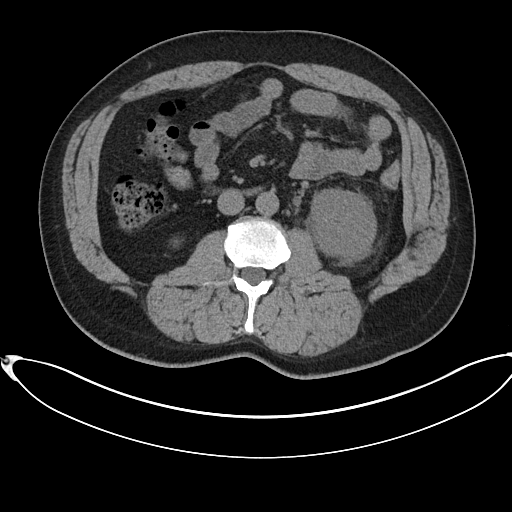
[im 68/105  soft-tissue]
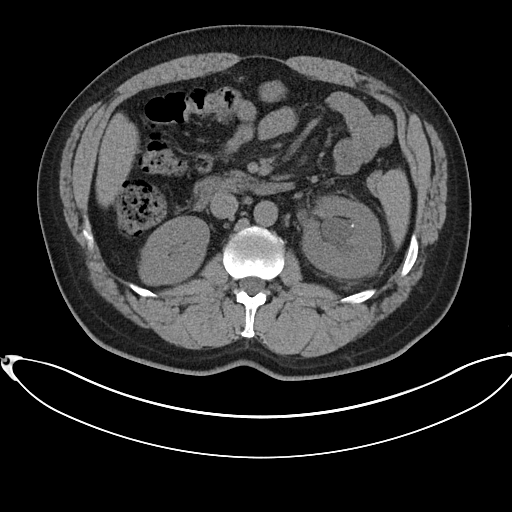
[im 68/105  bone]
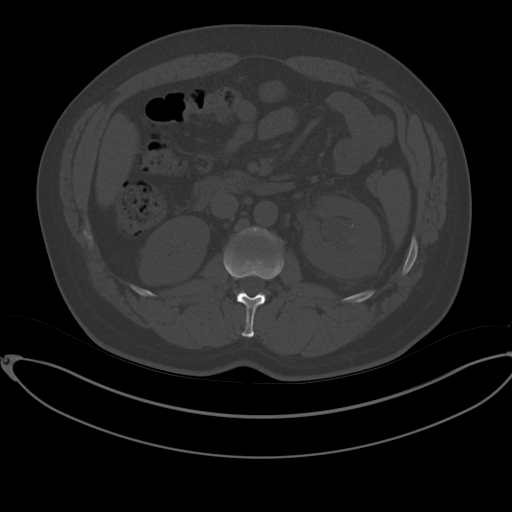
[im 74/105  soft-tissue]
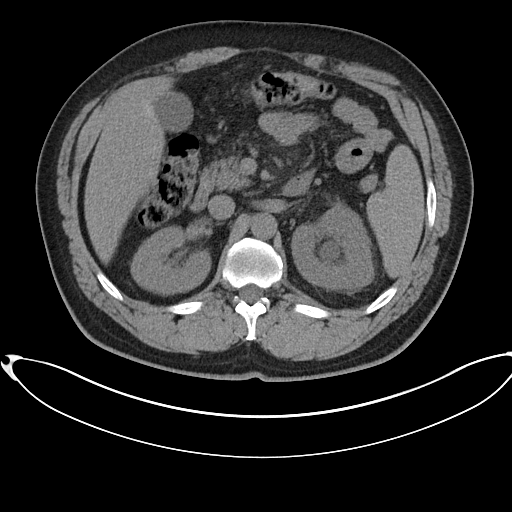
[im 80/105  soft-tissue]
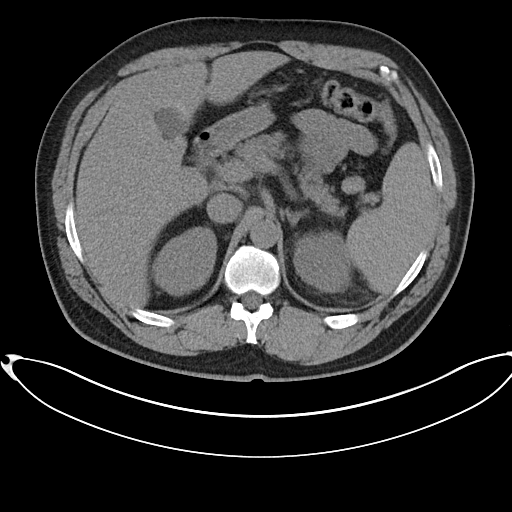
[im 92/105  soft-tissue]
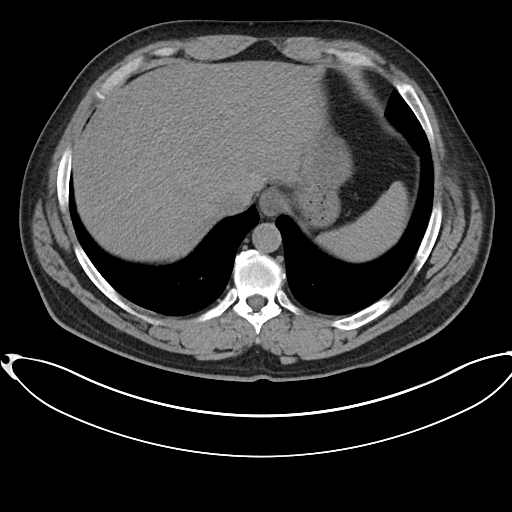
[im 98/105  soft-tissue]
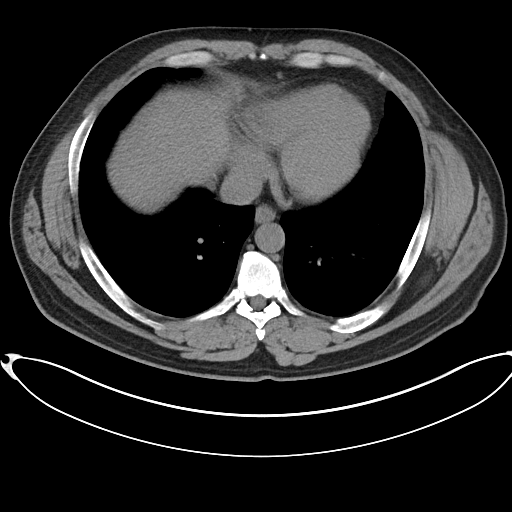

[Series 5: cor · coronal · 0.87mm/px · 3 of 106 slices shown]
[im 36/106  soft-tissue]
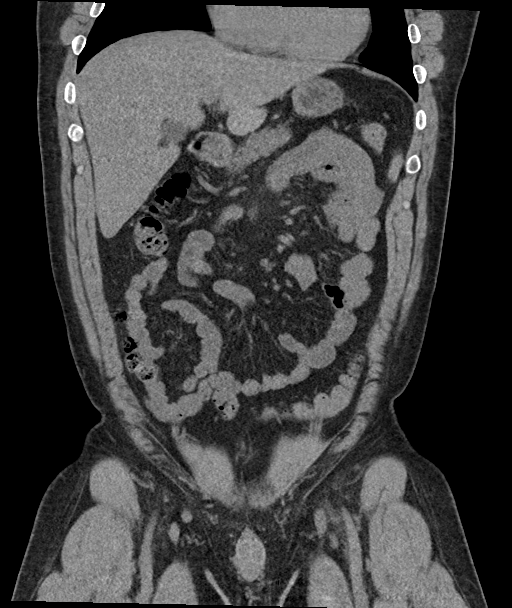
[im 47/106  soft-tissue]
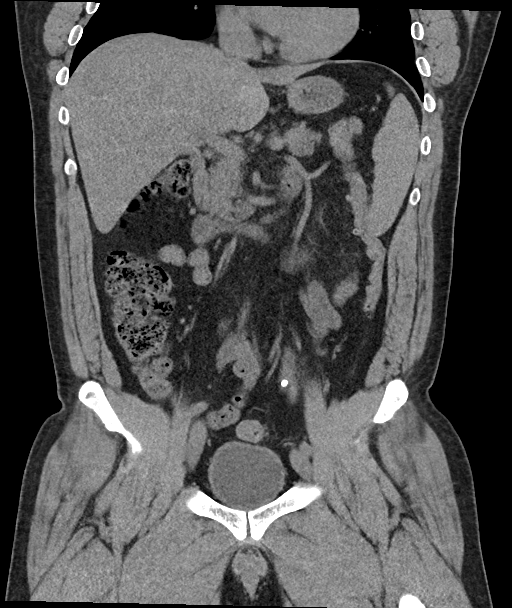
[im 59/106  soft-tissue]
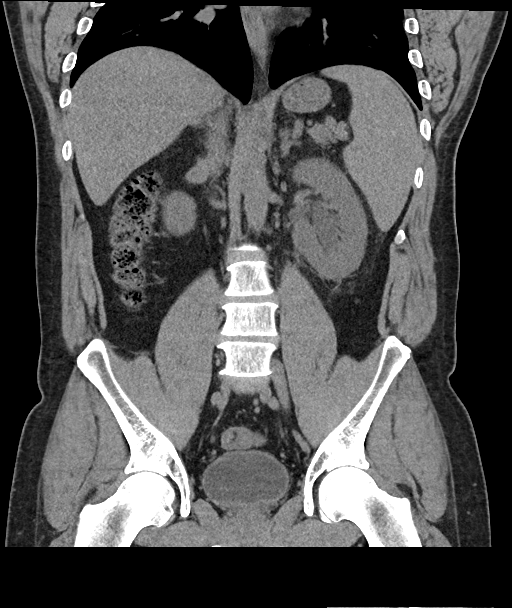

[16 of 46 positions shown; findings below may reference images not displayed]

FINDINGS: Lower chest: The lung bases are clear without focal nodule, mass, or
airspace disease. The heart size is normal. No significant pleural
or pericardial effusion is present.

Hepatobiliary: No focal liver abnormality is seen. No gallstones,
gallbladder wall thickening, or biliary dilatation.

Pancreas: Unremarkable. No pancreatic ductal dilatation or
surrounding inflammatory changes.

Spleen: Normal in size without focal abnormality.

Adrenals/Urinary Tract: Adrenal glands are normal bilaterally.
Moderate left-sided hydronephrosis is secondary to an obstructing 6
mm stone in the distal left ureter at the pelvic inlet. More distal
ureter is within normal limits. A second nonobstructing stone is
present in the midportion of the left kidney, measuring 3 mm. No
significant right-sided stones are present. Right ureter is within
normal limits. The urinary bladder is unremarkable.

Stomach/Bowel: Stomach is within normal limits. Appendix appears
normal. No evidence of bowel wall thickening, distention, or
inflammatory changes.

Vascular/Lymphatic: Minimal atherosclerotic changes are present in
the proximal right iliac artery, stable. No significant adenopathy
is present.

Reproductive: Prostate is unremarkable.

Other: A paraumbilical hernia contains fat without bowel. No
obstruction is scratched at no other significant ventral hernias are
present. No free air or free fluid is present.

Musculoskeletal: No acute or significant osseous findings.
IMPRESSION: 1. Moderate left-sided hydronephrosis secondary to an obstructing 6
mm stone in the distal left ureter at the pelvic inlet.
2. Additional nonobstructing 3 mm stone in the midportion of the
left kidney.
3. Paraumbilical hernia contains fat without bowel.

## 2022-09-29 ENCOUNTER — Encounter: Payer: Self-pay | Admitting: Urology

## 2022-10-03 IMAGING — CR DG ABDOMEN 1V
2 series · 2 of 2 positions shown · non-contrast
Comparison: CT dated 06/13/2021.

CLINICAL DATA: Kidney stone.

EXAM:
ABDOMEN - 1 VIEW

[abdomen kub (1 of 2)]
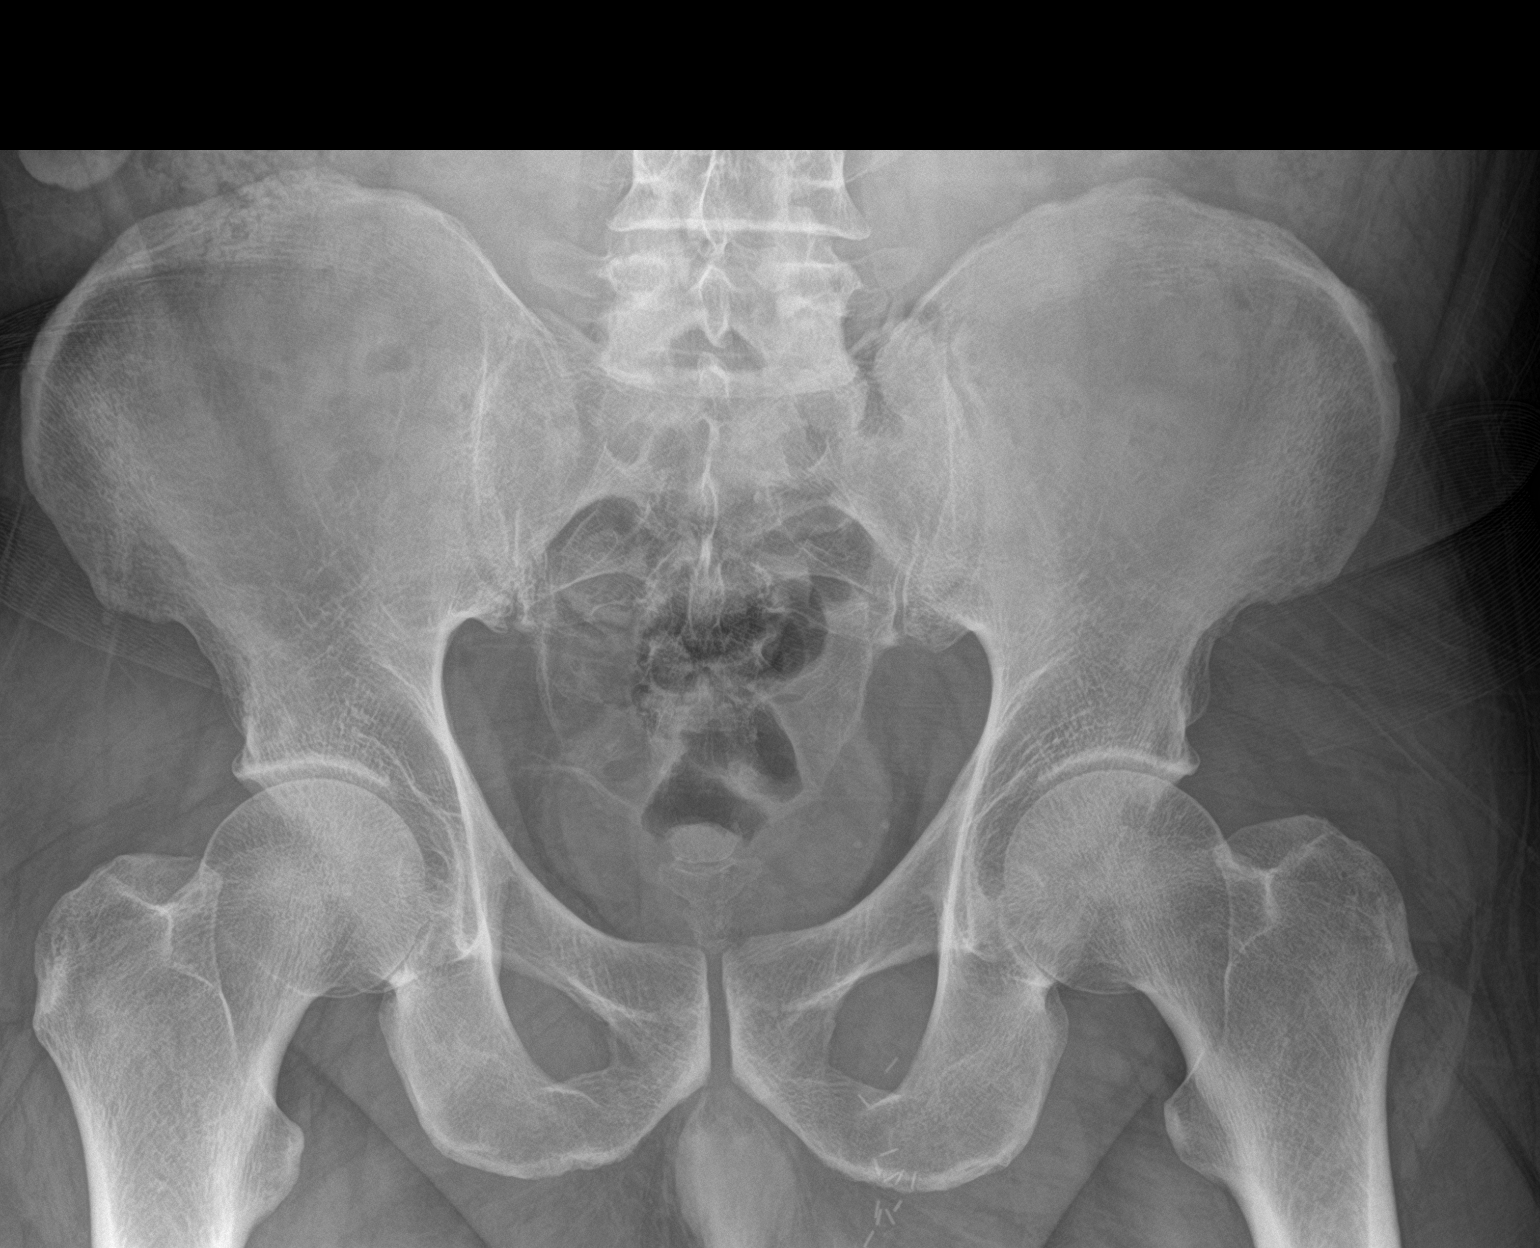

[abdomen kub (2 of 2)]
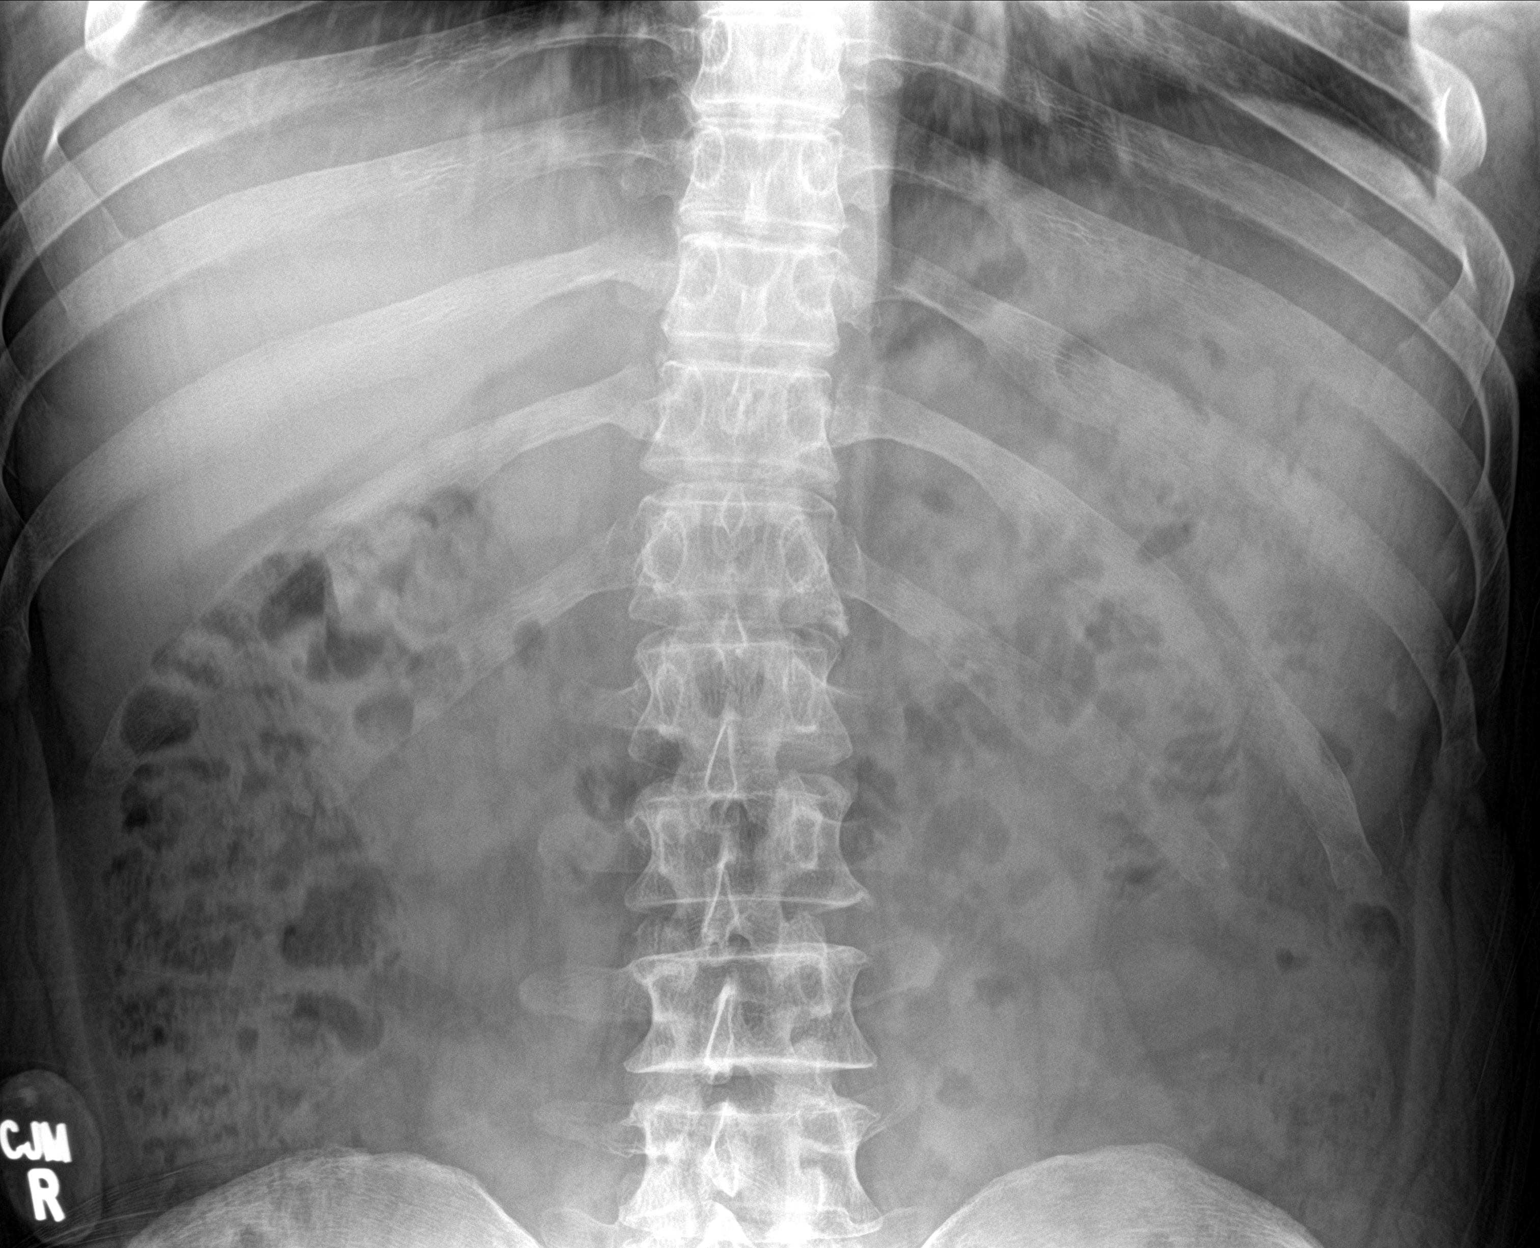

[2 of 2 positions shown; findings below may reference images not displayed]

FINDINGS: No bowel dilatation or evidence of obstruction. No free air or
radiopaque calculi. The stone seen in the distal left ureter on the
prior CT is not visualized on today's radiograph. The osseous
structures are intact. The soft tissues are unremarkable.
IMPRESSION: Negative.

## 2022-10-13 ENCOUNTER — Ambulatory Visit: Payer: BC Managed Care – PPO | Admitting: Physician Assistant

## 2022-10-17 ENCOUNTER — Encounter: Payer: Self-pay | Admitting: Physician Assistant

## 2023-03-13 ENCOUNTER — Ambulatory Visit
Admission: RE | Admit: 2023-03-13 | Discharge: 2023-03-13 | Disposition: A | Discharge status: Home / Self Care | Payer: BC Managed Care – PPO | Source: Ambulatory Visit | Attending: Emergency Medicine | Admitting: Emergency Medicine

## 2023-03-13 VITALS — BP 120/73 | HR 84 | Temp 100.0°F | Resp 15 | Ht 70.0 in | Wt 244.9 lb

## 2023-03-13 DIAGNOSIS — J101 Influenza due to other identified influenza virus with other respiratory manifestations: Secondary | ICD-10-CM | POA: Diagnosis present

## 2023-03-13 DIAGNOSIS — R059 Cough, unspecified: Secondary | ICD-10-CM

## 2023-03-13 LAB — URINALYSIS, W/ REFLEX TO CULTURE (INFECTION SUSPECTED)
Bilirubin Urine: NEGATIVE
Glucose, UA: NEGATIVE mg/dL
Hgb urine dipstick: NEGATIVE
Ketones, ur: NEGATIVE mg/dL
Leukocytes,Ua: NEGATIVE
Nitrite: NEGATIVE
Protein, ur: NEGATIVE mg/dL
RBC / HPF: NONE SEEN RBC/hpf (ref 0–5)
Specific Gravity, Urine: 1.025 (ref 1.005–1.030)
Squamous Epithelial / HPF: NONE SEEN /[HPF] (ref 0–5)
WBC, UA: NONE SEEN WBC/hpf (ref 0–5)
pH: 6.5 (ref 5.0–8.0)

## 2023-03-13 LAB — RESP PANEL BY RT-PCR (FLU A&B, COVID) ARPGX2
Influenza A by PCR: POSITIVE — AB
Influenza B by PCR: NEGATIVE
SARS Coronavirus 2 by RT PCR: NEGATIVE

## 2023-03-13 LAB — GROUP A STREP BY PCR: Group A Strep by PCR: NOT DETECTED

## 2023-03-13 MED ORDER — PROMETHAZINE-DM 6.25-15 MG/5ML PO SYRP: 5.0000 mL | ORAL_SOLUTION | Freq: Every evening | ORAL | 0 refills | Status: DC | PRN

## 2023-03-13 MED ORDER — PREDNISONE 10 MG (21) PO TBPK: ORAL_TABLET | Freq: Every day | ORAL | 0 refills | Status: DC

## 2023-03-13 MED ORDER — BENZONATATE 100 MG PO CAPS: 100.0000 mg | ORAL_CAPSULE | Freq: Three times a day (TID) | ORAL | 0 refills | Status: DC

## 2023-03-13 MED ORDER — OSELTAMIVIR PHOSPHATE 75 MG PO CAPS: 75.0000 mg | ORAL_CAPSULE | Freq: Two times a day (BID) | ORAL | 0 refills | Status: DC

## 2023-03-13 NOTE — Discharge Instructions (Signed)
Your symptoms today are most likely being caused by a virus and should steadily improve in time it can take up to 7 to 10 days before you truly start to see a turnaround however things will get better  Begin Tamiflu to help reduce the amount of virus in your body and ideally this will calm symptoms and severity of illness, take every morning and every evening for 10 days  Starting tomorrow take prednisone as directed to open and relax the airway ideally will help settle some of your coughing as well as muscle soreness  You may use Tessalon pill taking 1 to 2 tablets every 8 hours for cough, may use cough syrup at bedtime to allow for rest    You can take Tylenol and/or Ibuprofen as needed for fever reduction and pain relief.   For cough: honey 1/2 to 1 teaspoon (you can dilute the honey in water or another fluid).  You can also use guaifenesin and dextromethorphan for cough. You can use a humidifier for chest congestion and cough.  If you don't have a humidifier, you can sit in the bathroom with the hot shower running.      For sore throat: try warm salt water gargles, cepacol lozenges, throat spray, warm tea or water with lemon/honey, popsicles or ice, or OTC cold relief medicine for throat discomfort.   For congestion: take a daily anti-histamine like Zyrtec, Claritin, and a oral decongestant, such as pseudoephedrine.  You can also use Flonase 1-2 sprays in each nostril daily.   It is important to stay hydrated: drink plenty of fluids (water, gatorade/powerade/pedialyte, juices, or teas) to keep your throat moisturized and help further relieve irritation/discomfort.

## 2023-03-13 NOTE — ED Provider Notes (Signed)
[facility]    CSN: [id number] Arrival date & time: 03/13/23  1626      History   Chief Complaint Chief Complaint  Patient presents with   Fever    Appointment   Cough    HPI AMEDEE AMOAH is a 52 y.o. male.   Patient presents for evaluation of fever peaking at 101, chills, body aches, bilateral ill fullness, worse to the right side, nasal congestion, chest congestion, sore throat present for 2 days.  Shortness of breath and wheezing occurring with coughing, coughing exacerbating sore throat and back pain.  Poor appetite but tolerating fluids.  Over the last 2 days has experienced urinary frequency but endorses decreased fluid intake, denies urgency, hematuria, dysuria.  Associated nausea without vomiting, denies diarrhea or abdominal pain.  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   Past Medical History:  Diagnosis Date   Anxiety    Hypothyroidism    Thyroid disease     There are no problems to display for this patient.   Past Surgical History:  Procedure Laterality Date   CYSTOSCOPY W/ RETROGRADES Left 06/25/2021   Procedure: CYSTOSCOPY WITH RETROGRADE PYELOGRAM;  Surgeon: Riki Altes, MD;  Location: [facility];  Service: Urology;  Laterality: Left;   CYSTOSCOPY/URETEROSCOPY/HOLMIUM LASER/STENT PLACEMENT Left 06/25/2021   Procedure: CYSTOSCOPY/URETEROSCOPY/HOLMIUM LASER/STONE EXTRACTION/STENT PLACEMENT;  Surgeon: Riki Altes, MD;  Location: [facility];  Service: Urology;  Laterality: Left;   DISTAL BICEPS TENDON REPAIR Right 03/08/2020   Procedure: DISTAL BICEPS TENDON REPAIR;  Surgeon: Christena Flake, MD;  Location: [facility];  Service: Orthopedics;  Laterality: Right;   EXTRACORPOREAL SHOCK WAVE LITHOTRIPSY Right 09/04/2022   Procedure: EXTRACORPOREAL SHOCK WAVE LITHOTRIPSY (ESWL);  Surgeon: Sondra Come, MD;  Location: [facility];  Service: Urology;  Laterality: Right;   TESTICLE TORSION REDUCTION Left 2006   VASECTOMY          Home Medications    Prior to Admission medications   Medication Sig Start Date End Date Taking? Authorizing Provider  levothyroxine (SYNTHROID, LEVOTHROID) 50 MCG tablet Take 50 mcg by mouth daily before breakfast.   Yes [provider]  acyclovir (ZOVIRAX) 400 MG tablet Take 400 mg by mouth 3 (three) times daily as needed (for cold sore).    [provider]  calcium carbonate (TUMS - DOSED IN MG ELEMENTAL CALCIUM) 500 MG chewable tablet Chew 1 tablet by mouth daily.    [provider]  diazepam (VALIUM) 5 MG tablet Take 5 mg by mouth every 12 (twelve) hours as needed for anxiety. 06/17/21   [provider]  DULoxetine (CYMBALTA) 60 MG capsule Take 60 mg by mouth daily. Patient not taking: Reported on 09/04/2022 05/29/21   [provider]  ondansetron (ZOFRAN-ODT) 4 MG disintegrating tablet Take 1 tablet (4 mg total) by mouth every 8 (eight) hours as needed for nausea or vomiting. 09/03/22   Carman Ching, PA-C  oxybutynin (DITROPAN) 5 MG tablet 1 tab tid prn frequency,urgency, bladder spasm 06/25/21   Stoioff, Verna Czech, MD  tamsulosin (FLOMAX) 0.4 MG CAPS capsule Take 1 capsule (0.4 mg total) by mouth daily. 09/02/22   Carman Ching, PA-C  colchicine 0.6 MG tablet 2 tabs po x 1, then one tab po 1 hour later 02/18/20 02/25/20  Domenick Gong, MD  loratadine (CLARITIN) 10 MG tablet Take 10 mg by mouth daily.  02/18/20  [provider]    Family History History reviewed. No pertinent family  history.  Social History Social History   Tobacco Use   Smoking status: Never   Smokeless tobacco: Never  Vaping Use   Vaping status: Never Used  Substance Use Topics   Alcohol use: No   Drug use: No     Allergies   Sulfa antibiotics, Augmentin [amoxicillin-pot clavulanate], Penicillins, and Tramadol   Review of Systems Review of Systems   Physical Exam Triage Vital Signs ED Triage Vitals  Encounter Vitals  Group     BP 03/13/23 1638 120/73     Systolic BP Percentile --      Diastolic BP Percentile --      Pulse Rate 03/13/23 1638 84     Resp 03/13/23 1638 15     Temp 03/13/23 1638 100 F (37.8 C)     Temp Source 03/13/23 1638 Oral     SpO2 03/13/23 1638 97 %     Weight 03/13/23 1636 244 lb 14.9 oz (111.1 kg)     Height 03/13/23 1636 5\' 10"  (1.778 m)     Head Circumference --      Peak Flow --      Pain Score --      Pain Loc --      Pain Education --      Exclude from Growth Chart --    No data found.  Updated Vital Signs BP 120/73 (BP Location: Right Arm)   Pulse 84   Temp 100 F (37.8 C) (Oral)   Resp 15   Ht 5\' 10"  (1.778 m)   Wt 244 lb 14.9 oz (111.1 kg)   SpO2 97%   BMI 35.14 kg/m   Visual Acuity Right Eye Distance:   Left Eye Distance:   Bilateral Distance:    Right Eye Near:   Left Eye Near:    Bilateral Near:     Physical Exam Constitutional:      Appearance: Normal appearance.  HENT:     Head: Normocephalic.     Right Ear: Tympanic membrane, ear canal and external ear normal.     Left Ear: Tympanic membrane, ear canal and external ear normal.     Nose: Congestion present. No rhinorrhea.     Mouth/Throat:     Pharynx: No oropharyngeal exudate or posterior oropharyngeal erythema.  Cardiovascular:     Rate and Rhythm: Normal rate and regular rhythm.     Pulses: Normal pulses.     Heart sounds: Normal heart sounds.  Pulmonary:     Effort: Pulmonary effort is normal.     Breath sounds: Normal breath sounds.  Skin:    General: Skin is warm and dry.  Neurological:     Mental Status: He is alert and oriented to person, place, and time. Mental status is at baseline.      UC Treatments / Results  Labs (all labs ordered are listed, but only abnormal results are displayed) Labs Reviewed  RESP PANEL BY RT-PCR (FLU A&B, COVID) ARPGX2 - Abnormal; Notable for the following components:      Result Value   Influenza A by PCR POSITIVE (*)    All other  components within normal limits  URINALYSIS, W/ REFLEX TO CULTURE (INFECTION SUSPECTED) - Abnormal; Notable for the following components:   Bacteria, UA RARE (*)    All other components within normal limits  GROUP A STREP BY PCR    EKG   Radiology No results found.  Procedures Procedures (including critical care time)  Medications Ordered in UC Medications -  No data to display  Initial Impression / Assessment and Plan / UC Course  I have reviewed the triage vital signs and the nursing notes.  Pertinent labs & imaging results that were available during my care of the patient were reviewed by me and considered in my medical decision making (see chart for details).  Influenza A  Patient is in no signs of distress nor toxic appearing.  Vital signs are stable.  Low suspicion for pneumonia, pneumothorax or bronchitis and therefore will defer imaging.  COVID, strep negative, influenza A positive, discussed findings with patient and spouse.  Prescribed Tamiflu, prednisone, Tessalon and Promethazine DM.May use additional over-the-counter medications as needed for supportive care.  May follow-up with urgent care as needed if symptoms persist or worsen.  Note given.   Final Clinical Impressions(s) / UC Diagnoses   Final diagnoses:  Influenza A   Discharge Instructions   None    ED Prescriptions   None    PDMP not reviewed this encounter.   Valinda Hoar, NP 03/13/23 1746

## 2023-03-13 NOTE — ED Triage Notes (Signed)
Patient c/o cough, chest congestion, and fever that started 2 days ago.  Patient states that his son has walking pneumonia.

## 2023-03-17 ENCOUNTER — Other Ambulatory Visit: Payer: Self-pay | Admitting: Student

## 2023-03-17 DIAGNOSIS — M7522 Bicipital tendinitis, left shoulder: Secondary | ICD-10-CM

## 2023-03-17 DIAGNOSIS — S43302S Subluxation of unspecified parts of left shoulder girdle, sequela: Secondary | ICD-10-CM

## 2023-03-17 DIAGNOSIS — M7582 Other shoulder lesions, left shoulder: Secondary | ICD-10-CM

## 2023-04-09 ENCOUNTER — Encounter: Payer: Self-pay | Admitting: Student

## 2023-04-15 ENCOUNTER — Ambulatory Visit
Admission: RE | Admit: 2023-04-15 | Discharge: 2023-04-15 | Disposition: A | Discharge status: Home / Self Care | Payer: 59 | Source: Ambulatory Visit | Attending: Student | Admitting: Student

## 2023-04-15 DIAGNOSIS — S43302S Subluxation of unspecified parts of left shoulder girdle, sequela: Secondary | ICD-10-CM

## 2023-04-15 DIAGNOSIS — M7582 Other shoulder lesions, left shoulder: Secondary | ICD-10-CM

## 2023-04-15 DIAGNOSIS — M7522 Bicipital tendinitis, left shoulder: Secondary | ICD-10-CM

## 2023-04-15 MED ORDER — IOPAMIDOL (ISOVUE-M 200) INJECTION 41%
12.0000 mL | Freq: Once | INTRAMUSCULAR | Status: AC
  Administered 2023-04-15: 12 mL via INTRA_ARTICULAR

## 2023-06-06 DIAGNOSIS — S43432A Superior glenoid labrum lesion of left shoulder, initial encounter: Secondary | ICD-10-CM

## 2023-06-06 HISTORY — DX: Superior glenoid labrum lesion of left shoulder, initial encounter: S43.432A

## 2023-06-22 ENCOUNTER — Other Ambulatory Visit: Payer: Self-pay | Admitting: Surgery

## 2023-06-24 ENCOUNTER — Encounter
Admission: RE | Admit: 2023-06-24 | Discharge: 2023-06-24 | Disposition: A | Discharge status: Home / Self Care | Source: Ambulatory Visit | Attending: Surgery | Admitting: Surgery

## 2023-06-24 ENCOUNTER — Other Ambulatory Visit: Payer: Self-pay

## 2023-06-24 HISTORY — DX: Gout, unspecified: M10.9

## 2023-06-24 HISTORY — DX: Fatty (change of) liver, not elsewhere classified: K76.0

## 2023-06-24 HISTORY — DX: Unspecified cirrhosis of liver: K74.60

## 2023-06-24 HISTORY — DX: Other intervertebral disc degeneration, lumbosacral region without mention of lumbar back pain or lower extremity pain: M51.379

## 2023-06-24 HISTORY — DX: Personal history of urinary calculi: Z87.442

## 2023-06-24 HISTORY — DX: Obstructive sleep apnea (adult) (pediatric): G47.33

## 2023-06-24 HISTORY — DX: Spinal stenosis, lumbosacral region: M48.07

## 2023-06-24 HISTORY — DX: Gastro-esophageal reflux disease without esophagitis: K21.9

## 2023-06-24 NOTE — Patient Instructions (Addendum)
 Your procedure is scheduled on: Thursday, March 27 Report to the Registration Desk on the 1st floor of the [facility] To find out your arrival time, please call [phone] between 1PM - 3PM on: Wednesday, March 26 If your arrival time is 6:00 am, do not arrive before that time as the [facility] entrance doors do not open until 6:00 am.  REMEMBER: Instructions that are not followed completely may result in serious medical risk, up to and including death; or upon the discretion of your surgeon and anesthesiologist your surgery may need to be rescheduled.  Do not eat food after midnight the night before surgery.  No gum chewing or hard candies.  You may however, drink CLEAR liquids up to 2 hours before you are scheduled to arrive for your surgery. Do not drink anything within 2 hours of your scheduled arrival time.  Clear liquids include: - water  - apple juice without pulp - gatorade (not RED colors) - black coffee or tea (Do NOT add milk or creamers to the coffee or tea) Do NOT drink anything that is not on this list.  In addition, your doctor has ordered for you to drink the provided:  Ensure Pre-Surgery Clear Carbohydrate Drink  Drinking this carbohydrate drink up to two hours before surgery helps to reduce insulin resistance and improve patient outcomes. Please complete drinking 2 hours before scheduled arrival time.  One week prior to surgery: starting March 20 Stop Anti-inflammatories (NSAIDS) such as Advil, Aleve, Ibuprofen, Motrin, Naproxen, Naprosyn and Aspirin based products such as Excedrin, Goody's Powder, BC Powder. Stop ANY OVER THE COUNTER supplements until after surgery.  You may however, continue to take Tylenol if needed for pain up until the day of surgery.  Continue taking all of your other prescription medications up until the day of surgery.  ON THE DAY OF SURGERY ONLY TAKE THESE MEDICATIONS WITH SIPS OF WATER:  Levothyroxine  No Alcohol for 24 hours  before or after surgery.  No Smoking including e-cigarettes for 24 hours before surgery.  No chewable tobacco products for at least 6 hours before surgery.  No nicotine patches on the day of surgery.  Do not use any "recreational" drugs for at least a week (preferably 2 weeks) before your surgery.  Please be advised that the combination of cocaine and anesthesia may have negative outcomes, up to and including death. If you test positive for cocaine, your surgery will be cancelled.  On the morning of surgery brush your teeth with toothpaste and water, you may rinse your mouth with mouthwash if you wish. Do not swallow any toothpaste or mouthwash.  Use CHG Soap as directed on instruction sheet.  Do not wear jewelry, make-up, hairpins, clips or nail polish.  For welded (permanent) jewelry: bracelets, anklets, waist bands, etc.  Please have this removed prior to surgery.  If it is not removed, there is a chance that hospital personnel will need to cut it off on the day of surgery.  Do not wear lotions, powders, or perfumes.   Do not shave body hair from the neck down 48 hours before surgery.  Contact lenses, hearing aids and dentures may not be worn into surgery.  Do not bring valuables to the hospital. [facility] is not responsible for any missing/lost belongings or valuables.   Bring your C-PAP to the hospital in case you may have to spend the night.   Notify your doctor if there is any change in your medical condition (cold, fever,  infection).  Wear comfortable clothing (specific to your surgery type) to the hospital.  After surgery, you can help prevent lung complications by doing breathing exercises.  Take deep breaths and cough every 1-2 hours. Your doctor may order a device called an Incentive Spirometer to help you take deep breaths.  If you are being discharged the day of surgery, you will not be allowed to drive home. You will need a responsible individual to drive you  home and stay with you for 24 hours after surgery.   If you are taking public transportation, you will need to have a responsible individual with you.  Please call the Pre-admissions Testing Dept. at [phone] if you have any questions about these instructions.  Surgery Visitation Policy:  Patients having surgery or a procedure may have two visitors.  Children under the age of 61 must have an adult with them who is not the patient.  Temporary Visitor Restrictions Due to increasing cases of flu, RSV and COVID-19: Children ages 39 and under will not be able to visit patients in [facility] hospitals under most circumstances.      Preparing for Surgery with CHLORHEXIDINE GLUCONATE (CHG) Soap  Chlorhexidine Gluconate (CHG) Soap  o An antiseptic cleaner that kills germs and bonds with the skin to continue killing germs even after washing  o Used for showering the night before surgery and morning of surgery  Before surgery, you can play an important role by reducing the number of germs on your skin.  CHG (Chlorhexidine gluconate) soap is an antiseptic cleanser which kills germs and bonds with the skin to continue killing germs even after washing.  Please do not use if you have an allergy to CHG or antibacterial soaps. If your skin becomes reddened/irritated stop using the CHG.  1. Shower the NIGHT BEFORE SURGERY and the MORNING OF SURGERY with CHG soap.  2. If you choose to wash your hair, wash your hair first as usual with your normal shampoo.  3. After shampooing, rinse your hair and body thoroughly to remove the shampoo.  4. Use CHG as you would any other liquid soap. You can apply CHG directly to the skin and wash gently with a scrungie or a clean washcloth.  5. Apply the CHG soap to your body only from the neck down. Do not use on open wounds or open sores. Avoid contact with your eyes, ears, mouth, and genitals (private parts). Wash face and genitals (private parts) with  your normal soap.  6. Wash thoroughly, paying special attention to the area where your surgery will be performed.  7. Thoroughly rinse your body with warm water.  8. Do not shower/wash with your normal soap after using and rinsing off the CHG soap.  9. Pat yourself dry with a clean towel.  10. Wear clean pajamas to bed the night before surgery.  12. Place clean sheets on your bed the night of your first shower and do not sleep with pets.  13. Shower again with the CHG soap on the day of surgery prior to arriving at the hospital.  14. Do not apply any deodorants/lotions/powders.  15. Please wear clean clothes to the hospital.

## 2023-07-01 MED ORDER — CHLORHEXIDINE GLUCONATE 0.12 % MT SOLN
15.0000 mL | Freq: Once | OROMUCOSAL | Status: AC
  Administered 2023-07-02: 15 mL via OROMUCOSAL

## 2023-07-01 MED ORDER — LACTATED RINGERS IV SOLN
INTRAVENOUS | Status: DC
Start: 2023-07-01 — End: 2023-07-02

## 2023-07-01 MED ORDER — CEFAZOLIN SODIUM-DEXTROSE 2-4 GM/100ML-% IV SOLN
2.0000 g | INTRAVENOUS | Status: AC
  Administered 2023-07-02: 2 g via INTRAVENOUS

## 2023-07-01 MED ORDER — ORAL CARE MOUTH RINSE: 15.0000 mL | Freq: Once | OROMUCOSAL | Status: AC

## 2023-07-02 ENCOUNTER — Encounter
Admission: RE | Disposition: A | Discharge status: Home / Self Care | Payer: Self-pay | Source: Home / Self Care | Attending: Surgery

## 2023-07-02 ENCOUNTER — Ambulatory Visit
Admission: RE | Admit: 2023-07-02 | Discharge: 2023-07-02 | Disposition: A | Discharge status: Home / Self Care | Attending: Surgery | Admitting: Surgery

## 2023-07-02 ENCOUNTER — Other Ambulatory Visit: Payer: Self-pay

## 2023-07-02 ENCOUNTER — Encounter: Payer: Self-pay | Admitting: Surgery

## 2023-07-02 ENCOUNTER — Ambulatory Visit: Admitting: General Practice

## 2023-07-02 ENCOUNTER — Ambulatory Visit

## 2023-07-02 DIAGNOSIS — M7522 Bicipital tendinitis, left shoulder: Secondary | ICD-10-CM | POA: Diagnosis present

## 2023-07-02 DIAGNOSIS — Z79899 Other long term (current) drug therapy: Secondary | ICD-10-CM | POA: Diagnosis not present

## 2023-07-02 DIAGNOSIS — M7542 Impingement syndrome of left shoulder: Secondary | ICD-10-CM | POA: Diagnosis not present

## 2023-07-02 DIAGNOSIS — K760 Fatty (change of) liver, not elsewhere classified: Secondary | ICD-10-CM | POA: Insufficient documentation

## 2023-07-02 DIAGNOSIS — G4730 Sleep apnea, unspecified: Secondary | ICD-10-CM | POA: Insufficient documentation

## 2023-07-02 DIAGNOSIS — X58XXXA Exposure to other specified factors, initial encounter: Secondary | ICD-10-CM | POA: Diagnosis not present

## 2023-07-02 DIAGNOSIS — E039 Hypothyroidism, unspecified: Secondary | ICD-10-CM | POA: Diagnosis not present

## 2023-07-02 DIAGNOSIS — M7502 Adhesive capsulitis of left shoulder: Secondary | ICD-10-CM | POA: Diagnosis not present

## 2023-07-02 DIAGNOSIS — S43432A Superior glenoid labrum lesion of left shoulder, initial encounter: Secondary | ICD-10-CM | POA: Diagnosis present

## 2023-07-02 DIAGNOSIS — Z79890 Hormone replacement therapy (postmenopausal): Secondary | ICD-10-CM | POA: Diagnosis not present

## 2023-07-02 HISTORY — PX: POSTERIOR LUMBAR FUSION 2 WITH HARDWARE REMOVAL: SHX7297

## 2023-07-02 SURGERY — ARTHROSCOPY, SHOULDER WITH DEBRIDEMENT
Anesthesia: General | Site: Shoulder | Laterality: Left

## 2023-07-02 MED ORDER — KETOROLAC TROMETHAMINE 30 MG/ML IJ SOLN
30.0000 mg | Freq: Once | INTRAMUSCULAR | Status: AC
  Administered 2023-07-02: 30 mg via INTRAVENOUS

## 2023-07-02 MED ORDER — FENTANYL CITRATE (PF) 100 MCG/2ML IJ SOLN: 25.0000 ug | INTRAMUSCULAR | Status: DC | PRN

## 2023-07-02 MED ORDER — BUPIVACAINE LIPOSOME 1.3 % IJ SUSP
INTRAMUSCULAR | Status: AC
  Filled 2023-07-02: qty 20

## 2023-07-02 MED ORDER — LIDOCAINE HCL (CARDIAC) PF 100 MG/5ML IV SOSY
PREFILLED_SYRINGE | INTRAVENOUS | Status: DC | PRN
  Administered 2023-07-02: 60 mg via INTRAVENOUS

## 2023-07-02 MED ORDER — LACTATED RINGERS IV SOLN
INTRAVENOUS | Status: DC | PRN
  Administered 2023-07-02: 3001 mL

## 2023-07-02 MED ORDER — CEFAZOLIN SODIUM-DEXTROSE 2-4 GM/100ML-% IV SOLN
INTRAVENOUS | Status: AC
  Filled 2023-07-02: qty 100

## 2023-07-02 MED ORDER — ONDANSETRON HCL 4 MG/2ML IJ SOLN: 4.0000 mg | Freq: Four times a day (QID) | INTRAMUSCULAR | Status: DC | PRN

## 2023-07-02 MED ORDER — BUPIVACAINE-EPINEPHRINE (PF) 0.5% -1:200000 IJ SOLN
INTRAMUSCULAR | Status: AC
  Filled 2023-07-02: qty 10

## 2023-07-02 MED ORDER — DEXTROSE-SODIUM CHLORIDE 5-0.9 % IV SOLN: INTRAVENOUS | Status: DC

## 2023-07-02 MED ORDER — OXYCODONE HCL 5 MG/5ML PO SOLN: 5.0000 mg | Freq: Once | ORAL | Status: DC | PRN

## 2023-07-02 MED ORDER — MIDAZOLAM HCL 2 MG/2ML IJ SOLN
INTRAMUSCULAR | Status: AC
  Filled 2023-07-02: qty 2

## 2023-07-02 MED ORDER — BUPIVACAINE HCL (PF) 0.5 % IJ SOLN
INTRAMUSCULAR | Status: DC | PRN
  Administered 2023-07-02: 10 mL

## 2023-07-02 MED ORDER — BUPIVACAINE-EPINEPHRINE 0.5% -1:200000 IJ SOLN
INTRAMUSCULAR | Status: DC | PRN
  Administered 2023-07-02: 30 mL

## 2023-07-02 MED ORDER — ONDANSETRON HCL 4 MG PO TABS: 4.0000 mg | ORAL_TABLET | Freq: Four times a day (QID) | ORAL | Status: DC | PRN

## 2023-07-02 MED ORDER — OXYCODONE HCL 5 MG PO TABS: 5.0000 mg | ORAL_TABLET | Freq: Once | ORAL | Status: DC | PRN

## 2023-07-02 MED ORDER — OXYCODONE HCL 5 MG PO TABS: 5.0000 mg | ORAL_TABLET | ORAL | 0 refills | Status: DC | PRN

## 2023-07-02 MED ORDER — BUPIVACAINE HCL (PF) 0.5 % IJ SOLN
INTRAMUSCULAR | Status: AC
  Filled 2023-07-02: qty 10

## 2023-07-02 MED ORDER — MIDAZOLAM HCL 2 MG/2ML IJ SOLN
2.0000 mg | Freq: Once | INTRAMUSCULAR | Status: AC
  Administered 2023-07-02: 2 mg via INTRAVENOUS

## 2023-07-02 MED ORDER — METOCLOPRAMIDE HCL 10 MG PO TABS: 5.0000 mg | ORAL_TABLET | Freq: Three times a day (TID) | ORAL | Status: DC | PRN

## 2023-07-02 MED ORDER — MIDAZOLAM HCL 2 MG/2ML IJ SOLN: 1.0000 mg | INTRAMUSCULAR | Status: DC | PRN

## 2023-07-02 MED ORDER — MIDAZOLAM HCL 2 MG/2ML IJ SOLN
INTRAMUSCULAR | Status: DC | PRN
  Administered 2023-07-02: 2 mg via INTRAVENOUS

## 2023-07-02 MED ORDER — DEXAMETHASONE SODIUM PHOSPHATE 10 MG/ML IJ SOLN
INTRAMUSCULAR | Status: DC | PRN
  Administered 2023-07-02: 5 mg via INTRAVENOUS

## 2023-07-02 MED ORDER — FENTANYL CITRATE PF 50 MCG/ML IJ SOSY
50.0000 ug | PREFILLED_SYRINGE | Freq: Once | INTRAMUSCULAR | Status: AC
  Administered 2023-07-02: 50 ug via INTRAVENOUS

## 2023-07-02 MED ORDER — RINGERS IRRIGATION IR SOLN
Status: DC | PRN
Start: 2023-07-02 — End: 2023-07-02
  Administered 2023-07-02: 2000 mL

## 2023-07-02 MED ORDER — FENTANYL CITRATE PF 50 MCG/ML IJ SOSY
PREFILLED_SYRINGE | INTRAMUSCULAR | Status: AC
  Filled 2023-07-02: qty 1

## 2023-07-02 MED ORDER — SUGAMMADEX SODIUM 200 MG/2ML IV SOLN
INTRAVENOUS | Status: DC | PRN
  Administered 2023-07-02: 200 mg via INTRAVENOUS

## 2023-07-02 MED ORDER — ONDANSETRON HCL 4 MG/2ML IJ SOLN
INTRAMUSCULAR | Status: AC
  Filled 2023-07-02: qty 2

## 2023-07-02 MED ORDER — BUPIVACAINE LIPOSOME 1.3 % IJ SUSP
INTRAMUSCULAR | Status: DC | PRN
  Administered 2023-07-02: 20 mL

## 2023-07-02 MED ORDER — PHENYLEPHRINE HCL-NACL 20-0.9 MG/250ML-% IV SOLN
INTRAVENOUS | Status: AC
  Filled 2023-07-02: qty 250

## 2023-07-02 MED ORDER — KETOROLAC TROMETHAMINE 30 MG/ML IJ SOLN
INTRAMUSCULAR | Status: AC
  Filled 2023-07-02: qty 1

## 2023-07-02 MED ORDER — PROPOFOL 10 MG/ML IV BOLUS
INTRAVENOUS | Status: DC | PRN
  Administered 2023-07-02: 200 mg via INTRAVENOUS

## 2023-07-02 MED ORDER — EPINEPHRINE PF 1 MG/ML IJ SOLN
INTRAMUSCULAR | Status: AC
  Filled 2023-07-02: qty 1

## 2023-07-02 MED ORDER — METOCLOPRAMIDE HCL 5 MG/ML IJ SOLN: 5.0000 mg | Freq: Three times a day (TID) | INTRAMUSCULAR | Status: DC | PRN

## 2023-07-02 MED ORDER — CHLORHEXIDINE GLUCONATE 0.12 % MT SOLN
OROMUCOSAL | Status: AC
  Filled 2023-07-02: qty 15

## 2023-07-02 MED ORDER — OXYCODONE HCL 5 MG PO TABS: 5.0000 mg | ORAL_TABLET | ORAL | Status: DC | PRN

## 2023-07-02 MED ORDER — FENTANYL CITRATE (PF) 100 MCG/2ML IJ SOLN
INTRAMUSCULAR | Status: DC | PRN
  Administered 2023-07-02 (×2): 50 ug via INTRAVENOUS

## 2023-07-02 MED ORDER — ROCURONIUM BROMIDE 100 MG/10ML IV SOLN
INTRAVENOUS | Status: DC | PRN
  Administered 2023-07-02: 50 mg via INTRAVENOUS
  Administered 2023-07-02: 20 mg via INTRAVENOUS

## 2023-07-02 MED ORDER — DEXAMETHASONE SODIUM PHOSPHATE 10 MG/ML IJ SOLN
INTRAMUSCULAR | Status: AC
  Filled 2023-07-02: qty 1

## 2023-07-02 MED ORDER — SUGAMMADEX SODIUM 200 MG/2ML IV SOLN
INTRAVENOUS | Status: AC
  Filled 2023-07-02: qty 2

## 2023-07-02 MED ORDER — FENTANYL CITRATE (PF) 100 MCG/2ML IJ SOLN
INTRAMUSCULAR | Status: AC
  Filled 2023-07-02: qty 2

## 2023-07-02 MED ORDER — ONDANSETRON HCL 4 MG/2ML IJ SOLN
INTRAMUSCULAR | Status: DC | PRN
  Administered 2023-07-02: 4 mg via INTRAVENOUS

## 2023-07-02 SURGICAL SUPPLY — 45 items
ANCHOR QFIX KTLS 1.8 MINI BLUE (Anchor) IMPLANT
BIT DRILL JUGRKNT W/NDL BIT2.9 (DRILL) IMPLANT
BLADE FULL RADIUS 3.5 (BLADE) ×1 IMPLANT
BUR ACROMIONIZER 4.0 (BURR) ×1 IMPLANT
CHLORAPREP W/TINT 26 (MISCELLANEOUS) ×1 IMPLANT
COOLER POLAR GLACIER W/PUMP (MISCELLANEOUS) IMPLANT
COVER MAYO STAND STRL (DRAPES) ×1 IMPLANT
DILATOR 5.5 THREADED HEALICOIL (MISCELLANEOUS) IMPLANT
DRILL JUGGERKNOT W/NDL BIT 2.9 (DRILL) IMPLANT
ELECT CAUTERY BLADE 6.4 (BLADE) ×1 IMPLANT
ELECT REM PT RETURN 9FT ADLT (ELECTROSURGICAL) ×1 IMPLANT
ELECTRODE REM PT RTRN 9FT ADLT (ELECTROSURGICAL) ×1 IMPLANT
GAUZE SPONGE 4X4 12PLY STRL (GAUZE/BANDAGES/DRESSINGS) ×1 IMPLANT
GAUZE XEROFORM 1X8 LF (GAUZE/BANDAGES/DRESSINGS) ×1 IMPLANT
GLOVE BIO SURGEON STRL SZ7.5 (GLOVE) ×2 IMPLANT
GLOVE BIO SURGEON STRL SZ8 (GLOVE) ×2 IMPLANT
GLOVE BIOGEL PI IND STRL 8 (GLOVE) ×1 IMPLANT
GLOVE INDICATOR 8.0 STRL GRN (GLOVE) ×1 IMPLANT
GOWN STRL REUS W/ TWL LRG LVL3 (GOWN DISPOSABLE) ×1 IMPLANT
GOWN STRL REUS W/ TWL XL LVL3 (GOWN DISPOSABLE) ×1 IMPLANT
GRASPER SUT 15 45D LOW PRO (SUTURE) IMPLANT
IV LR IRRIG 3000ML ARTHROMATIC (IV SOLUTION) ×1 IMPLANT
KIT CANNULA 8X76-LX IN CANNULA (CANNULA) ×1 IMPLANT
KIT SUTURE 1.8 Q-FIX DISP (KITS) IMPLANT
LASSO QUICK PASS 45DEG CVD RT (SUTURE) IMPLANT
MANIFOLD NEPTUNE II (INSTRUMENTS) ×1 IMPLANT
MASK FACE SPIDER DISP (MASK) ×1 IMPLANT
MAT ABSORB FLUID 56X50 GRAY (MISCELLANEOUS) ×1 IMPLANT
PACK ARTHROSCOPY SHOULDER (MISCELLANEOUS) ×1 IMPLANT
PAD ABD DERMACEA PRESS 5X9 (GAUZE/BANDAGES/DRESSINGS) ×2 IMPLANT
PASSER SUT FIRSTPASS SELF (INSTRUMENTS) IMPLANT
SLING ULTRA II LG (MISCELLANEOUS) ×1 IMPLANT
SPONGE T-LAP 18X18 ~~LOC~~+RFID (SPONGE) ×1 IMPLANT
STAPLER SKIN PROX 35W (STAPLE) ×1 IMPLANT
STRAP SAFETY 5IN WIDE (MISCELLANEOUS) ×1 IMPLANT
SUT ETHIBOND 0 MO6 C/R (SUTURE) ×1 IMPLANT
SUT ULTRABRAID 2 COBRAID 38 (SUTURE) IMPLANT
SUT VIC AB 2-0 CT1 TAPERPNT 27 (SUTURE) ×2 IMPLANT
TAPE MICROFOAM 4IN (TAPE) ×1 IMPLANT
TRAP FLUID SMOKE EVACUATOR (MISCELLANEOUS) ×1 IMPLANT
TUBE SET DOUBLEFLO INFLOW (TUBING) ×1 IMPLANT
TUBING CONNECTING 10 (TUBING) ×1 IMPLANT
WAND WEREWOLF FASTSEAL 6.0 (MISCELLANEOUS) IMPLANT
WAND WEREWOLF FLOW 90D (MISCELLANEOUS) ×1 IMPLANT
WATER STERILE IRR 500ML POUR (IV SOLUTION) ×1 IMPLANT

## 2023-07-02 NOTE — Op Note (Signed)
 07/02/2023  2:45 PM  Patient:   Shawn Sheppard  Pre-Op Diagnosis:   Impingement/tendinopathy with Type II SLAP tear, left shoulder.  Post-Op Diagnosis:   Impingement/tendinopathy with Type II SLAP tear, adhesive capsulitis, and biceps tendinopathy, left shoulder.  Procedure:   Extensive arthroscopic debridement, arthroscopic SLAP repair, arthroscopic subacromial decompression, and manipulation under anesthesia, left shoulder.  Anesthesia:   General endotracheal with interscalene block using Exparel placed preoperatively by the anesthesiologist.  Surgeon:   Maryagnes Amos, MD  Assistant:   Horris Latino, PA-C  Findings:   As above. The rotator cuff was in satisfactory condition, as were the articular surfaces of the glenoid and humerus. There was a type II SLAP tear extending from the 11:30 to the 1:30 position. The biceps tendon demonstrated moderate "lip sticking" without partial or full-thickness tearing.  Prior to manipulation, the left shoulder could be forward flexed to 145 and abducted to 135. At 90 of abduction, the shoulder could be externally rotated to 75 and internally rotated to 60. Following manipulation, the shoulder could be forward flexed to 160, abducted to 155 and, at 90 of abduction, externally rotated to 90 and internally rotated to 65.  Complications:   None  Estimated blood loss:   15 cc  Fluids:   500 cc  Tourniquet time:   None  Drains:   None  Closure:   Staples      Brief clinical note:   The patient is a 53 year old male with a history of progressively worsening left shoulder pain. The patient's symptoms have progressed despite medications, activity modification, etc. The patient's history and examination are consistent with impingement/tendinopathy.  A preoperative MRI scan demonstrated evidence of tendinopathy with a possible bursal sided partial-thickness tear involving the anterior insertional fibers of the supraspinatus, as well as a SLAP tear  with a postero-superior paralabral cyst. The patient presents at this time for definitive management of these shoulder symptoms.  Procedure:   The patient underwent placement of an interscalene block using Exparel by the anesthesiologist in the preoperative holding area before being brought into the operating room and lain in the supine position. The patient then underwent general endotracheal intubation and anesthesia before being repositioned in the beach chair position using the beach chair positioner. The left shoulder and upper extremity were prepped with ChloraPrep solution before being draped sterilely. Preoperative antibiotics were administered. A timeout was performed to confirm the proper surgical site before the left shoulder was gently manipulated in both abduction and external rotation, as well as adduction and internal rotation. Several palpable and audible pops were heard as the scar tissue released, permitting full range of motion of the shoulder.    The left shoulder and upper extremity were prepped with ChloraPrep solution before being draped sterilely. Preoperative antibiotics were administered. The expected portal sites and incision site were injected with 0.5% Sensorcaine with epinephrine.   A posterior portal was created and the glenohumeral joint thoroughly inspected with the findings as described above. An anterior portal was created using an outside-in technique. The labrum and rotator cuff were further probed, again confirming the above-noted findings. The areas of labral fraying were debrided back to stable margins using the full-radius resector.  In addition, areas of extensive synovitis anteriorly, superiorly, posteriorly were debrided back to stable margins using the full-radius resector. The ArthroCare wand was inserted and used to release the biceps tendon from its labral anchor. It also was used to debride the rotator interval to minimize the risk of post-operative  adhesive  capsulitis. Finally, the ArthroCare wand was used to obtain hemostasis as well as to "anneal" the labrum superiorly and anteriorly.   A separate superolateral portal was created using an outside-in technique to assist with repair of the SLAP tear. A single 1.8 mm [employer] Q-Fix Knotless anchor was placed at the 12-12:30 position and used to repair the SLAP tear. The adequacy of repair was assessed by probing and found to be excellent. The instruments were removed from the joint after suctioning the excess fluid.  The camera was repositioned through the posterior portal into the subacromial space. A separate lateral portal was created using an outside-in technique. The 3.5 mm full-radius resector was introduced and used to perform a subtotal bursectomy. The ArthroCare wand was then inserted and used to remove the periosteal tissue off the undersurface of the anterior third of the acromion as well as to recess the coracoacromial ligament from its attachment along the anterior and lateral margins of the acromion. The 4.0 mm acromionizing bur was introduced and used to complete the decompression by removing the undersurface of the anterior third of the acromion. The full radius resector was reintroduced to remove any residual bony debris before the ArthroCare wand was reintroduced to obtain hemostasis. The instruments were then removed from the subacromial space after suctioning the excess fluid.  Incorporating the previously created superolateral portal site, an approximately 3-4 cm incision was made over the anterolateral aspect of the shoulder beginning at the anterolateral corner of the acromion and extending distally in line with the bicipital groove. This incision was carried down through the subcutaneous tissues to expose the deltoid fascia. The raphae between the anterior and middle thirds was identified and this plane developed to provide access into the subacromial space. Additional bursal tissues  were debrided sharply using Metzenbaum scissors. The rotator cuff tear was carefully inspected and found to be intact on its bursal surface.  Given that there was no need for a rotator cuff repair, it was felt best to leave the biceps tendon tenolysis rather than to perform a formal biceps tenodesis so as to permit the patient to begin earlier range of motion and thereby reduce the likelihood of developing post-operative adhesive capsulitis.  The wound was copiously irrigated with sterile saline solution before the deltoid raphae was reapproximated using 2-0 Vicryl interrupted sutures. The subcutaneous tissues were closed in two layers using 2-0 Vicryl interrupted sutures before the skin was closed using staples. The portal sites also were closed using staples. A sterile bulky dressing was applied to the shoulder before the arm was placed into a shoulder immobilizer. The patient was then awakened, extubated, and returned to the recovery room in satisfactory condition after tolerating the procedure well.

## 2023-07-02 NOTE — Anesthesia Preprocedure Evaluation (Signed)
 Anesthesia Evaluation  Patient identified by MRN, date of birth, ID band Patient awake    Reviewed: Allergy & Precautions, NPO status , Patient's Chart, lab work & pertinent test results  History of Anesthesia Complications Negative for: history of anesthetic complications  Airway Mallampati: III  TM Distance: <3 FB Neck ROM: full    Dental  (+) Chipped, Dental Advidsory Given   Pulmonary neg shortness of breath, sleep apnea    Pulmonary exam normal        Cardiovascular Exercise Tolerance: Good (-) angina (-) Past MI and (-) DOE negative cardio ROS Normal cardiovascular exam     Neuro/Psych  PSYCHIATRIC DISORDERS      negative neurological ROS     GI/Hepatic negative GI ROS,neg GERD  ,,Fatty liver   Endo/Other  Hypothyroidism    Renal/GU      Musculoskeletal   Abdominal   Peds  Hematology negative hematology ROS (+)   Anesthesia Other Findings Past Medical History: No date: Anxiety No date: Hypothyroidism No date: Thyroid disease  Past Surgical History: 03/08/2020: DISTAL BICEPS TENDON REPAIR; Right     Comment:  Procedure: DISTAL BICEPS TENDON REPAIR;  Surgeon: Christena Flake, MD;  Location: [facility];  Service: Orthopedics;                Laterality: Right; 2006: TESTICLE TORSION REDUCTION; Left No date: VASECTOMY  BMI    Body Mass Index: 34.87 kg/m      Reproductive/Obstetrics negative OB ROS                             Anesthesia Physical Anesthesia Plan  ASA: 3  Anesthesia Plan: General ETT   Post-op Pain Management: Regional block*   Induction: Intravenous  PONV Risk Score and Plan: 2 and Ondansetron, Dexamethasone and Midazolam  Airway Management Planned: Oral ETT  Additional Equipment:   Intra-op Plan:   Post-operative Plan: Extubation in OR  Informed Consent: I have reviewed the patients History and Physical, chart, labs and discussed the  procedure including the risks, benefits and alternatives for the proposed anesthesia with the patient or authorized representative who has indicated his/her understanding and acceptance.     Dental Advisory Given  Plan Discussed with: Anesthesiologist, CRNA and Surgeon  Anesthesia Plan Comments: (Patient consented for risks of anesthesia including but not limited to:  - adverse reactions to medications - damage to eyes, teeth, lips or other oral mucosa - nerve damage due to positioning  - sore throat or hoarseness - Damage to heart, brain, nerves, lungs, other parts of body or loss of life  Patient voiced understanding and assent.)       Anesthesia Quick Evaluation

## 2023-07-02 NOTE — Transfer of Care (Signed)
 Immediate Anesthesia Transfer of Care Note  Patient: Shawn Sheppard  Procedure(s) Performed: ARTHROSCOPY, SHOULDER WITH DEBRIDEMENT (Left: Shoulder)  Patient Location: PACU  Anesthesia Type:General  Level of Consciousness: drowsy  Airway & Oxygen Therapy: Patient Spontanous Breathing and Patient connected to face mask oxygen  Post-op Assessment: Report given to RN and Post -op Vital signs reviewed and stable  Post vital signs: Reviewed and stable  Last Vitals:  Vitals Value Taken Time  BP 138/85 07/02/23 1500  Temp 36.1 C 07/02/23 1500  Pulse 74 07/02/23 1503  Resp 13 07/02/23 1503  SpO2 100 % 07/02/23 1503  Vitals shown include unfiled device data.  Last Pain:  Vitals:   07/02/23 1500  TempSrc:   PainSc: Asleep         Complications: No notable events documented.

## 2023-07-02 NOTE — Anesthesia Procedure Notes (Signed)
 Procedure Name: Intubation Date/Time: 07/02/2023 1:31 PM  Performed by: Monico Hoar, CRNAPre-anesthesia Checklist: Patient identified, Patient being monitored, Timeout performed, Emergency Drugs available and Suction available Patient Re-evaluated:Patient Re-evaluated prior to induction Oxygen Delivery Method: Circle system utilized Preoxygenation: Pre-oxygenation with 100% oxygen Induction Type: IV induction Ventilation: Mask ventilation without difficulty Laryngoscope Size: 3 and Glidescope Grade View: Grade I Tube type: Oral Tube size: 7.0 mm Number of attempts: 1 Airway Equipment and Method: Stylet Placement Confirmation: ETT inserted through vocal cords under direct vision, positive ETCO2 and breath sounds checked- equal and bilateral Secured at: 21 cm Tube secured with: Tape Dental Injury: Teeth and Oropharynx as per pre-operative assessment

## 2023-07-02 NOTE — H&P (Addendum)
 History of Present Illness:  Shawn Sheppard is a 53 y.o. male who presents today for history and physical for an upcoming left shoulder arthroscopy to be done on July 02, 2023 by Dr. Joice Sheppard.  The patient's symptoms began over a year ago and developed without any specific cause or injury. However, the patient is an avid weightlifter and does recall an apparent subluxation injury to his left shoulder back in high school while wrestling. Regardless, the patient was doing well until these symptoms developed a year or so ago. He saw Shawn Latino, PA-C, in February of last year. X-rays at that time showed no significant abnormality so the patient was started on an oral steroid which provided little if any relief of his symptoms. Subsequently, he was given a steroid injection into the left shoulder which provided temporary partial relief of his symptoms. He followed up with Shawn Skeens, PA-C, last fall who gave him a steroid injection which provided little if any relief of his symptoms. Therefore, the patient was sent for an arthro-MRI scan of the left shoulder, then referred to me for further evaluation and treatment. The patient describes the symptoms as marked (major pain with significant limitations) and have the quality of being aching, miserable, nagging, stabbing, tender, and throbbing. The pain is localized to the lateral arm/shoulder and localized to the anterior shoulder. These symptoms are aggravated with normal daily activities, with sleeping, carrying heavy objects, at higher levels of activity, with overhead activity, and reaching behind the back. He has tried acetaminophen, non-steroidal anti-inflammatories (Advil), and narcotics with limited benefit. He has tried rest and activity modification with limited benefit. He has tried the 2 injections as noted above with limited benefit. The patient denies any neck pain nor does he know any numbness or paresthesias down his arm to his hand. The patient is not  a diabetic. 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 He has lost 12 pounds in the last 2 weeks. This complaint is not work related. He is a recreational weight lifter.  Shoulder Surgical History:  The patient has had no shoulder surgery in the past.  Current Outpatient Medications:  colchicine (COLCRYS) 0.6 mg tablet Take 2 tablets (1.2mg ) by mouth at first sign of gout flare followed by 1 tablet (0.6mg ) after 1 hour. (Max 1.8mg  within 1 hour) do not repeat x 3 days 10 tablet 0  fluocinonide (LIDEX) 0.05 % ointment Apply topically 2 (two) times daily 60 g 1  HYDROcodone-acetaminophen (NORCO) 5-325 mg tablet Take 1-2 tablets by mouth at bedtime as needed for Pain 20 tablet 0  levothyroxine (SYNTHROID) 50 MCG tablet TAKE 1 TABLET BY MOUTH EVERY MORNING BEFORE BREAKFAST. TAKE ON AN EMPTY STOMACH WITH A GLASS OF WATER AT LEAST 30 TO 60 MINUTES BEFORE BREAKFAST. 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  Anxiety, generalized 07/28/2017  Cervical radiculitis 10/31/2015  Chronic back pain  Chronic insomnia 07/28/2017  DDD (degenerative disc disease), lumbosacral 12/15/2017  GERD (gastroesophageal reflux disease)  Gout, joint 2021  Herniated lumbar disc without myelopathy 11/08/2014  History of kidney stones 10/31/2021  Hyperlipidemia (has not been on meds before)  Midline low back pain with left-sided sciatica 11/18/2013  Rupture of right distal biceps tendon 03/08/2020  Sleep apnea  Thyroid disease   Past Surgical History:  REDUCTION TESTICULAR TORSION Left  2006  Primary repair of ruptured distal biceps tendon, right elbow 03/08/2020 (Dr. Joice Sheppard)  VASECTOMY   Family History:  Cirrhosis Mother Eber Jones  Breast cancer Mother Britta Mccreedy  Diabetes type II Mother  Britta Mccreedy  High blood pressure (Hypertension) Mother Britta Mccreedy  Kidney disease Mother Britta Mccreedy  Parkinsonism Father  Obesity Father  Nephrolithiasis Father  Cirrhosis Maternal Aunt  MASLD  Coronary Artery Disease (Blocked arteries around heart) Maternal Grandfather Zollie Beckers  Stroke Maternal Grandfather Zollie Beckers  aneurysm  Diabetes type II Paternal Grandmother Cameron  Heart defect Son  hyoplastic heart syndrome  Glaucoma Neg Hx  Macular degeneration Neg Hx  Liver disease Neg Hx  Colon cancer Neg Hx   Social History:   Socioeconomic History:  Marital status: Married  Tobacco Use  Smoking status: Never  Smokeless tobacco: Former  Types: Chew  Quit date: 04/03/2023  Vaping Use  Vaping status: Never Used  Substance and Sexual Activity  Alcohol use: No  Drug use: No  Sexual activity: Yes  Partners: Female  Birth control/protection: None  Comment: Wife  Social History Narrative  Married; 3 living children has 38 yo and 19 Yo living in home; 1 deceased son  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  Physical Exam:  Vitals:  BP: 130/68  Weight: (!) 116.3 kg (256 lb 6.4 oz)  Height: 177.8 cm (5\' 10" )  PainSc: 6 5  PainLoc: Left Shoulder   General/Constitutional: Pleasant husky middle-age male in no acute distress. Neuro/Psych: Normal mood and affect, oriented to person, place and time. Eyes: Non-icteric. Pupils are equal, round, and reactive to light, and exhibit synchronous movement. ENT: Unremarkable. Lymphatic: No palpable adenopathy. Respiratory: Lungs clear to auscultation, Normal chest excursion, No wheezes, and Non-labored breathing Cardiovascular: Regular rate and rhythm. No murmurs. and No edema, swelling  or tenderness, except as noted in detailed exam. Integumentary: No impressive skin lesions present, except as noted in detailed exam. Musculoskeletal: Unremarkable, except as noted in detailed exam.  Heart: Examination of the heart reveals regular, rate, and rhythm. There is no murmur noted on ascultation. There is a normal apical pulse.  Lungs: Lungs are clear to auscultation. There is no wheeze, rhonchi, or crackles. There is normal expansion of bilateral chest walls.   Left shoulder exam: SKIN: normal SWELLING: none WARMTH: none LYMPH NODES: no adenopathy palpable CREPITUS: none TENDERNESS: Mildly tender over anterolateral shoulder ROM (active):  Forward flexion: 95 degrees Abduction: 80 degrees Internal rotation: L3 ROM (passive):  Forward flexion: 125 degrees Abduction: 100 degrees ER/IR at 90 abd: 60 degrees / 50 degrees  He experiences moderate pain with all motions.  STRENGTH: Forward flexion: 5/5 Abduction: 5/5 External rotation: 4-4+/5 Internal rotation: 4+/5 Pain with RC testing: Mild pain with resisted forward flexion and abduction  STABILITY: Normal  SPECIAL TESTS: Juanetta Gosling' test: positive, mild Speed's test: positive Capsulitis - pain w/ passive ER: no Crossed arm test: Moderately positive Crank: Not evaluated Anterior apprehension: Negative Posterior apprehension: Not evaluated  He is neurovascularly intact to the left upper extremity.  Left Shoulder Imaging, MRI: MRI Shoulder Cartilage: No cartilage abnormality. MRI Shoulder Rotator Cuff: Partial thickness tear of the supraspinatus. No retraction. MRI Shoulder Labrum / Biceps: SLAP tear. Posterior superior paralabral cyst. MRI Shoulder Bone: Normal  bone.  Both the films and report were reviewed by myself and discussed with the patient.  Assessment:  1. Superior labrum anterior-to-posterior (SLAP) tear of left shoulder.  2. Rotator cuff tendinitis, left.  3. Nontraumatic incomplete tear of left  rotator cuff.  4. Biceps tendonitis on left.   Plan:  The treatment options were discussed with the patient. In addition, patient educational materials were provided regarding the diagnosis and treatment options. The patient is quite frustrated by his symptoms and functional limitations, and is ready to consider more aggressive treatment options. Therefore, I have recommended a surgical procedure, specifically a left shoulder arthroscopy with debridement, decompression, SLAP repair, possible rotator cuff repair, and probable biceps tenodesis. The procedure was discussed with the patient, as were the potential risks (including bleeding, infection, nerve and/or blood vessel injury, persistent or recurrent pain, failure of the repair, progression of arthritis, need for further surgery, blood clots, strokes, heart attacks and/or arhythmias, pneumonia, etc.) and benefits. The patient states his understanding and wishes to proceed. All of the patient's questions and concerns were answered. He can call any time with further concerns. He will follow up post-surgery, routine.    H&P reviewed and patient re-examined. No changes.

## 2023-07-02 NOTE — Anesthesia Procedure Notes (Signed)
 Anesthesia Regional Block: Interscalene brachial plexus block   Pre-Anesthetic Checklist: , timeout performed,  Correct Patient, Correct Site, Correct Laterality,  Correct Procedure, Correct Position, site marked,  Risks and benefits discussed,  Surgical consent,  Pre-op evaluation,  At surgeon's request and post-op pain management  Laterality: Left  Prep: chloraprep       Needles:  Injection technique: Single-shot  Needle Type: Echogenic Needle     Needle Length: 4cm  Needle Gauge: 25     Additional Needles:   Procedures:,,,, ultrasound used (permanent image in chart),,    Narrative:  Start time: 07/02/2023 12:45 PM End time: 07/02/2023 12:47 PM Injection made incrementally with aspirations every 5 mL.  Performed by: Personally  Anesthesiologist: Stephanie Coup, MD  Additional Notes: Patient's chart reviewed and they were deemed appropriate candidate for procedure, at surgeon's request. Patient educated about risks, benefits, and alternatives of the block including but not limited to: temporary or permanent nerve damage, bleeding, infection, damage to surround tissues, pneumothorax, hemidiaphragmatic paralysis, unilateral Horner's syndrome, block failure, local anesthetic toxicity. Patient expressed understanding. A formal time-out was conducted consistent with institution rules.  Monitors were applied, and minimal sedation used (see nursing record). The site was prepped with skin prep and allowed to dry, and sterile gloves were used. A high frequency linear ultrasound probe with probe cover was utilized throughout. C5-7 nerve roots located and appeared anatomically normal, local anesthetic injected around them, and echogenic block needle trajectory was monitored throughout. Aspiration performed every 5ml. Lung and blood vessels were avoided. All injections were performed without resistance and free of blood and paresthesias. The patient tolerated the procedure well.  Injectate:  20ml exparel + 10ml 0.5% bupivacaine

## 2023-07-02 NOTE — Discharge Instructions (Addendum)
 Orthopedic discharge instructions: Keep dressing dry and intact.  May shower after dressing changed on post-op day #4 (Monday).  Cover staples/sutures with Band-Aids after drying off. Apply ice frequently to shoulder. Take ibuprofen 600-800 mg TID with meals for 3-5 days, then as necessary. Take oxycodone as prescribed when needed.  Keep shoulder immobilizer on at all times except may remove for bathing purposes. Follow-up in 10-14 days or as scheduled.   SHOULDER SLING IMMOBILIZER   VIDEO Slingshot 2 Shoulder Brace Application - YouTube ---https://www.porter.info/  INSTRUCTIONS While supporting the injured arm, slide the forearm into the sling. Wrap the adjustable shoulder strap around the neck and shoulders and attach the strap end to the sling using  the "alligator strap tab."  Adjust the shoulder strap to the required length. Position the shoulder pad behind the neck. To secure the shoulder pad location (optional), pull the shoulder strap away from the shoulder pad, unfold the hook material on the top of the pad, then press the shoulder strap back onto the hook material to secure the pad in place. Attach the closure strap across the open top of the sling. Position the strap so that it holds the arm securely in the sling. Next, attach the thumb strap to the open end of the sling between the thumb and fingers. After sling has been fit, it may be easily removed and reapplied using the quick release buckle on shoulder strap. If a neutral pillow or 15 abduction pillow is included, place the pillow at the waistline. Attach the sling to the pillow, lining up hook material on the pillow with the loop on sling. Adjust the waist strap to fit.  If waist strap is too long, cut it to fit. Use the small piece of double sided hook material (located on top of the pillow) to secure the strap end. Place the double sided hook material on the inside of the cut strap end and secure it to  the waist strap.     If no pillow is included, attach the waist strap to the sling and adjust to fit.    Washing Instructions: Straps and sling must be removed and cleaned regularly depending on your activity level and perspiration. Hand wash straps and sling in cold water with mild detergent, rinse, air dry          Interscalene Nerve Block with Exparel   For your surgery you have received an Interscalene Nerve Block with Exparel. Nerve Blocks affect many types of nerves, including nerves that control movement, pain and normal sensation.  You may experience feelings such as numbness, tingling, heaviness, weakness or the inability to move your arm or the feeling or sensation that your arm has "fallen asleep". A nerve block with Exparel can last up to 5 days.  Usually the weakness wears off first.  The tingling and heaviness usually wear off next.  Finally you may start to notice pain.  Keep in mind that this may occur in any order.  Once a nerve block starts to wear off it is usually completely gone within 60 minutes. ISNB may cause mild shortness of breath, a hoarse voice, blurry vision, unequal pupils, or drooping of the face on the same side as the nerve block.  These symptoms will usually resolve with the numbness.  Very rarely the procedure itself can cause mild seizures. If needed, your surgeon will give you a prescription for pain medication.  It will take about 60 minutes for the oral pain medication to become  fully effective.  So, it is recommended that you start taking this medication before the nerve block first begins to wear off, or when you first begin to feel discomfort. Take your pain medication only as prescribed.  Pain medication can cause sedation and decrease your breathing if you take more than you need for the level of pain that you have. Nausea is a common side effect of many pain medications.  You may want to eat something before taking your pain medicine to prevent  nausea. After an Interscalene nerve block, you cannot feel pain, pressure or extremes in temperature in the effected arm.  Because your arm is numb it is at an increased risk for injury.  To decrease the possibility of injury, please practice the following:  While you are awake change the position of your arm frequently to prevent too much pressure on any one area for prolonged periods of time.  If you have a cast or tight dressing, check the color or your fingers every couple of hours.  Call your surgeon with the appearance of any discoloration (white or blue). If you are given a sling to wear before you go home, please wear it  at all times until the block has completely worn off.  Do not get up at night without your sling. Please contact [facility] or your surgeon if you do not begin to regain sensation after 7 days from the surgery.  Anesthesia may be contacted by calling the Same Day Surgery Department, Mon. through Fri., 6 am to 4 pm at [phone].   If you experience any other problems or concerns, please contact your surgeon's office. If you experience severe or prolonged shortness of breath go to the nearest emergency department.

## 2023-07-03 ENCOUNTER — Encounter: Payer: Self-pay | Admitting: Surgery

## 2023-07-03 NOTE — Anesthesia Postprocedure Evaluation (Signed)
 Anesthesia Post Note  Patient: Shawn Sheppard  Procedure(s) Performed: ARTHROSCOPY, SHOULDER WITH DEBRIDEMENT (Left: Shoulder)  Patient location during evaluation: PACU Anesthesia Type: General Level of consciousness: awake and alert Pain management: pain level controlled Vital Signs Assessment: post-procedure vital signs reviewed and stable Respiratory status: spontaneous breathing, nonlabored ventilation, respiratory function stable and patient connected to nasal cannula oxygen Cardiovascular status: blood pressure returned to baseline and stable Postop Assessment: no apparent nausea or vomiting Anesthetic complications: no  No notable events documented.   Last Vitals:  Vitals:   07/02/23 1600 07/02/23 1605  BP: (!) 160/99 133/76  Pulse: 76   Resp: 18 16  Temp: (!) 36.3 C   SpO2: 98%     Last Pain:  Vitals:   07/02/23 1600  TempSrc: Temporal  PainSc: 0-No pain                 Stephanie Coup

## 2023-11-18 ENCOUNTER — Telehealth: Payer: Self-pay

## 2023-11-18 ENCOUNTER — Other Ambulatory Visit: Payer: Self-pay | Admitting: Physician Assistant

## 2023-11-18 DIAGNOSIS — N200 Calculus of kidney: Secondary | ICD-10-CM

## 2023-11-18 NOTE — Telephone Encounter (Signed)
 Pt called and was looking to have his flomax  refilled. He has an extensive stone history and feels he is trying to pass a stone. I talked with Sam PA and we are going to refill his flomax  and he is coming in on Friday to be seen with a x-ray prior. Patient understood instructions.

## 2023-11-19 ENCOUNTER — Ambulatory Visit
Admission: RE | Admit: 2023-11-19 | Discharge: 2023-11-19 | Disposition: A | Discharge status: Home / Self Care | Source: Ambulatory Visit | Attending: Physician Assistant | Admitting: Physician Assistant

## 2023-11-19 DIAGNOSIS — N200 Calculus of kidney: Secondary | ICD-10-CM

## 2023-11-20 ENCOUNTER — Ambulatory Visit (INDEPENDENT_AMBULATORY_CARE_PROVIDER_SITE_OTHER): Admitting: Physician Assistant

## 2023-11-20 VITALS — BP 116/80 | HR 81 | Ht 70.0 in | Wt 202.0 lb

## 2023-11-20 DIAGNOSIS — R109 Unspecified abdominal pain: Secondary | ICD-10-CM | POA: Diagnosis not present

## 2023-11-20 DIAGNOSIS — Z87442 Personal history of urinary calculi: Secondary | ICD-10-CM

## 2023-11-20 LAB — MICROSCOPIC EXAMINATION

## 2023-11-20 LAB — URINALYSIS, COMPLETE
Bilirubin, UA: NEGATIVE
Glucose, UA: NEGATIVE
Ketones, UA: NEGATIVE
Leukocytes,UA: NEGATIVE
Nitrite, UA: NEGATIVE
Protein,UA: NEGATIVE
Specific Gravity, UA: 1.02 (ref 1.005–1.030)
Urobilinogen, Ur: 0.2 mg/dL (ref 0.2–1.0)
pH, UA: 6 (ref 5.0–7.5)

## 2023-11-20 MED ORDER — ONDANSETRON 4 MG PO TBDP: 4.0000 mg | ORAL_TABLET | Freq: Three times a day (TID) | ORAL | 0 refills | Status: AC | PRN

## 2023-11-20 NOTE — Patient Instructions (Signed)
 Imaging scheduling: [phone]

## 2023-11-20 NOTE — Progress Notes (Signed)
 11/20/2023 10:02 PM   Shawn Sheppard 04/27/1970 [id number]  CC: Chief Complaint  Patient presents with   Nephrolithiasis   Follow-up   HPI: Shawn Sheppard is a 53 y.o. 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   Today he reports 4-5 days of burning, intermittent left low back pain and nausea.  He was recent diagnosed with cirrhosis and has been making major lifestyle changes, having started consistent exercise, modifying his diet, and starting Zepbound. He has lost over 50lbs. He doses Zepbound on Fridays, due later today.  KUB today with no radiopaque ureteral stones.  In-office UA today positive for 3+ blood; urine microscopy with 11-30 RBCs/HPF.  PMH: Past Medical History:  Diagnosis Date   Anxiety    Cirrhosis, non-alcoholic (HCC)    DDD (degenerative disc disease), lumbosacral    GERD (gastroesophageal reflux disease)    Gout, joint    Herniated lumbar disc without myelopathy 2016   History of kidney stones    Hypothyroidism    Metabolic dysfunction-associated steatotic liver disease (MASLD)    Obstructive sleep apnea    Spinal stenosis of lumbosacral region    Superior labrum anterior-to-posterior (SLAP) tear of left shoulder 06/2023    Surgical History: Past Surgical History:  Procedure Laterality Date   CYSTOSCOPY W/ RETROGRADES Left 06/25/2021   Procedure: CYSTOSCOPY WITH RETROGRADE PYELOGRAM;  Surgeon: Twylla Glendia BROCKS, MD;  Location: [facility];  Service: Urology;  Laterality: Left;   CYSTOSCOPY/URETEROSCOPY/HOLMIUM LASER/STENT PLACEMENT Left 06/25/2021   Procedure: CYSTOSCOPY/URETEROSCOPY/HOLMIUM LASER/STONE EXTRACTION/STENT PLACEMENT;  Surgeon: Twylla Glendia BROCKS, MD;  Location: [facility];  Service: Urology;  Laterality: Left;   DISTAL BICEPS TENDON REPAIR Right 03/08/2020   Procedure: DISTAL BICEPS TENDON  REPAIR;  Surgeon: Edie Norleen PARAS, MD;  Location: [facility];  Service: Orthopedics;  Laterality: Right;   EXTRACORPOREAL SHOCK WAVE LITHOTRIPSY Right 09/04/2022   Procedure: EXTRACORPOREAL SHOCK WAVE LITHOTRIPSY (ESWL);  Surgeon: Francisca Redell BROCKS, MD;  Location: [facility];  Service: Urology;  Laterality: Right;   POSTERIOR LUMBAR FUSION 2 WITH HARDWARE REMOVAL Left 07/02/2023   Procedure: ARTHROSCOPY, SHOULDER WITH DEBRIDEMENT;  Surgeon: Edie Norleen PARAS, MD;  Location: [facility];  Service: Orthopedics;  Laterality: Left;  Open rotator cuff repair and biceps tenodesis   TESTICLE TORSION REDUCTION Left 2006   VASECTOMY      Home Medications:  Allergies as of 11/20/2023       Reactions   Hydrocodone -acetaminophen  Itching   Stage 4 Liver Disease    Ibuprofen  Other (See Comments)   Liver damage   Sulfa Antibiotics Other (See Comments)   blisters   Augmentin [amoxicillin-pot Clavulanate] Rash   Penicillins Rash   Tramadol Rash        Medication List        Accurate as of November 20, 2023 10:02 PM. If you have any questions, ask your nurse or doctor.          levothyroxine 50 MCG tablet Commonly known as: SYNTHROID Take 50 mcg by mouth daily before breakfast.   ondansetron  4 MG disintegrating tablet Commonly known as: ZOFRAN -ODT Take 1 tablet (4 mg total) by mouth every 8 (eight) hours as needed for nausea or vomiting.   oxyCODONE  5 MG immediate release tablet Commonly known as: Roxicodone  Take 1-2 tablets (5-10 mg total) by mouth every 4 (four) hours as needed for moderate pain (pain score 4-6)  or severe pain (pain score 7-10).   tamsulosin  0.4 MG Caps capsule Commonly known as: FLOMAX  TAKE 1 CAPSULE(0.4 MG) BY MOUTH DAILY   tirzepatide 10 MG/0.5ML injection vial Inject 10 mg into the skin every 7 (seven) days.        Allergies:  Allergies  Allergen Reactions   Hydrocodone -Acetaminophen  Itching    Stage 4 Liver Disease    Ibuprofen  Other (See Comments)    Liver damage    Sulfa Antibiotics Other (See Comments)    blisters   Augmentin [Amoxicillin-Pot Clavulanate] Rash   Penicillins Rash   Tramadol Rash    Family History: No family history on file.  Social History:   reports that he has never smoked. He has quit using smokeless tobacco.  His smokeless tobacco use included snuff. He reports that he does not drink alcohol and does not use drugs.  Physical Exam: BP 116/80 (BP Location: Left Arm, Patient Position: Sitting, Cuff Size: Large)   Pulse 81   Ht 5' 10 (1.778 m)   Wt 202 lb (91.6 kg)   SpO2 100%   BMI 28.98 kg/m   Constitutional:  Alert and oriented, no acute distress, nontoxic appearing HEENT: [place], AT Cardiovascular: No clubbing, cyanosis, or edema Respiratory: Normal respiratory effort, no increased work of breathing Skin: No rashes, bruises or suspicious lesions Neurologic: Grossly intact, no focal deficits, moving all 4 extremities Psychiatric: Normal mood and affect  Laboratory Data: Results for orders placed or performed in visit on 11/20/23  Microscopic Examination   Collection Time: 11/20/23 10:36 AM   Urine  Result Value Ref Range   WBC, UA 0-5 0 - 5 /hpf   RBC, Urine 11-30 (A) 0 - 2 /hpf   Epithelial Cells (non renal) 0-10 0 - 10 /hpf   Bacteria, UA Few None seen/Few  Urinalysis, Complete   Collection Time: 11/20/23 10:36 AM  Result Value Ref Range   Specific Gravity, UA 1.020 1.005 - 1.030   pH, UA 6.0 5.0 - 7.5   Color, UA Yellow Yellow   Appearance Ur Clear Clear   Leukocytes,UA Negative Negative   Protein,UA Negative Negative/Trace   Glucose, UA Negative Negative   Ketones, UA Negative Negative   RBC, UA 3+ (A) Negative   Bilirubin, UA Negative Negative   Urobilinogen, Ur 0.2 0.2 - 1.0 mg/dL   Nitrite, UA Negative Negative   Microscopic Examination See below:    Pertinent Imaging: KUB, 11/19/2023: See Epic  I personally reviewed the images referenced above and note no radiopaque urolithiasis, however  view is somewhat obscured by bowel contents.  Assessment & Plan:   1. Flank pain with history of urolithiasis (Primary) Left flank/low back pain and microscopic hematuria with a history of stones. I don't see anything on his KUB today, however read is limited due to bowel contents. I recommended CT stone study for definitive evaluation and he agreed. Continue Flomax , sending in Zofran .  He hopes to get the CT scan done mid-next week. I counseled him that he will need to be off GLP1 x1 week prior to any stone procedure and should take this into consideration prior to dosing later today. - Urinalysis, Complete - CULTURE, URINE COMPREHENSIVE - CT RENAL STONE STUDY; Future - ondansetron  (ZOFRAN -ODT) 4 MG disintegrating tablet; Take 1 tablet (4 mg total) by mouth every 8 (eight) hours as needed for nausea or vomiting.  Dispense: 20 tablet; Refill: 0  Return for Will call with results.  Subrina Vecchiarelli, PA-C  [facility]  [place] [address], [address] [place], [place] [address] [phone]

## 2023-11-24 LAB — CULTURE, URINE COMPREHENSIVE

## 2023-11-25 ENCOUNTER — Ambulatory Visit
Admission: RE | Admit: 2023-11-25 | Discharge: 2023-11-25 | Disposition: A | Discharge status: Home / Self Care | Source: Ambulatory Visit | Attending: Physician Assistant | Admitting: Physician Assistant

## 2023-11-25 ENCOUNTER — Ambulatory Visit: Payer: Self-pay | Admitting: Physician Assistant

## 2023-11-25 DIAGNOSIS — Z87442 Personal history of urinary calculi: Secondary | ICD-10-CM | POA: Insufficient documentation

## 2023-11-25 DIAGNOSIS — R109 Unspecified abdominal pain: Secondary | ICD-10-CM | POA: Insufficient documentation

## 2023-12-14 ENCOUNTER — Other Ambulatory Visit: Payer: Self-pay

## 2023-12-14 ENCOUNTER — Encounter: Payer: Self-pay | Admitting: *Deleted

## 2023-12-14 ENCOUNTER — Emergency Department

## 2023-12-14 ENCOUNTER — Emergency Department: Admission: EM | Admit: 2023-12-14 | Discharge: 2023-12-14 | Disposition: A | Discharge status: Home / Self Care

## 2023-12-14 DIAGNOSIS — S39011A Strain of muscle, fascia and tendon of abdomen, initial encounter: Secondary | ICD-10-CM | POA: Insufficient documentation

## 2023-12-14 DIAGNOSIS — R5383 Other fatigue: Secondary | ICD-10-CM | POA: Diagnosis not present

## 2023-12-14 DIAGNOSIS — X58XXXA Exposure to other specified factors, initial encounter: Secondary | ICD-10-CM | POA: Insufficient documentation

## 2023-12-14 DIAGNOSIS — M549 Dorsalgia, unspecified: Secondary | ICD-10-CM | POA: Diagnosis not present

## 2023-12-14 DIAGNOSIS — R0789 Other chest pain: Secondary | ICD-10-CM | POA: Diagnosis not present

## 2023-12-14 DIAGNOSIS — S3991XA Unspecified injury of abdomen, initial encounter: Secondary | ICD-10-CM | POA: Diagnosis present

## 2023-12-14 DIAGNOSIS — R1010 Upper abdominal pain, unspecified: Secondary | ICD-10-CM

## 2023-12-14 DIAGNOSIS — T148XXA Other injury of unspecified body region, initial encounter: Secondary | ICD-10-CM

## 2023-12-14 LAB — BASIC METABOLIC PANEL WITH GFR
Anion gap: 13 (ref 5–15)
BUN: 18 mg/dL (ref 6–20)
CO2: 25 mmol/L (ref 22–32)
Calcium: 9.7 mg/dL (ref 8.9–10.3)
Chloride: 102 mmol/L (ref 98–111)
Creatinine, Ser: 1.09 mg/dL (ref 0.61–1.24)
GFR, Estimated: >60 mL/min (ref >60)
Glucose, Bld: 97 mg/dL (ref 70–99)
Potassium: 4.1 mmol/L (ref 3.5–5.1)
Sodium: 140 mmol/L (ref 135–145)

## 2023-12-14 LAB — CBC
HCT: 45.2 % (ref 39.0–52.0)
Hemoglobin: 15.8 g/dL (ref 13.0–17.0)
MCH: 31.2 pg (ref 26.0–34.0)
MCHC: 35 g/dL (ref 30.0–36.0)
MCV: 89.2 fL (ref 80.0–100.0)
Platelets: 177 K/uL (ref 150–400)
RBC: 5.07 MIL/uL (ref 4.22–5.81)
RDW: 12.6 % (ref 11.5–15.5)
WBC: 7.6 K/uL (ref 4.0–10.5)
nRBC: 0 % (ref 0.0–0.2)

## 2023-12-14 LAB — HEPATIC FUNCTION PANEL
ALT: 30 U/L (ref 0–44)
AST: 23 U/L (ref 15–41)
Albumin: 4.4 g/dL (ref 3.5–5.0)
Alkaline Phosphatase: 40 U/L (ref 38–126)
Bilirubin, Direct: 0.2 mg/dL (ref 0.0–0.2)
Indirect Bilirubin: 1.1 mg/dL — ABNORMAL HIGH (ref 0.3–0.9)
Total Bilirubin: 1.3 mg/dL — ABNORMAL HIGH (ref 0.0–1.2)
Total Protein: 6.9 g/dL (ref 6.5–8.1)

## 2023-12-14 LAB — TROPONIN I (HIGH SENSITIVITY)
Troponin I (High Sensitivity): 3 ng/L (ref <18)
Troponin I (High Sensitivity): 3 ng/L (ref <18)

## 2023-12-14 LAB — LIPASE, BLOOD: Lipase: 101 U/L — ABNORMAL HIGH (ref 11–51)

## 2023-12-14 MED ORDER — METHOCARBAMOL 500 MG PO TABS: 1500.0000 mg | ORAL_TABLET | Freq: Three times a day (TID) | ORAL | 0 refills | Status: AC

## 2023-12-14 MED ORDER — IOHEXOL 300 MG/ML  SOLN
100.0000 mL | Freq: Once | INTRAMUSCULAR | Status: AC | PRN
  Administered 2023-12-14: 100 mL via INTRAVENOUS

## 2023-12-14 MED ORDER — CYCLOBENZAPRINE HCL 10 MG PO TABS
5.0000 mg | ORAL_TABLET | Freq: Once | ORAL | Status: DC
Start: 2023-12-14 — End: 2023-12-14

## 2023-12-14 MED ORDER — LIDOCAINE 5 % EX PTCH: 1.0000 | MEDICATED_PATCH | CUTANEOUS | 0 refills | Status: AC

## 2023-12-14 MED ORDER — KETOROLAC TROMETHAMINE 15 MG/ML IJ SOLN
30.0000 mg | Freq: Once | INTRAMUSCULAR | Status: AC
  Administered 2023-12-14: 30 mg via INTRAVENOUS
  Filled 2023-12-14: qty 2

## 2023-12-14 MED ORDER — IBUPROFEN 200 MG PO TABS: 600.0000 mg | ORAL_TABLET | Freq: Three times a day (TID) | ORAL | 0 refills | Status: DC | PRN

## 2023-12-14 MED ORDER — METHOCARBAMOL 500 MG PO TABS
1500.0000 mg | ORAL_TABLET | Freq: Once | ORAL | Status: AC
  Administered 2023-12-14: 1500 mg via ORAL
  Filled 2023-12-14: qty 3

## 2023-12-14 NOTE — ED Triage Notes (Signed)
 Pt ambulatory to triage.  Pt has upper abd pain and back pain.  Pt has 2 hernias and is scheduled for surgery this week.  Pt has liver disease and reports pain in left chest.  Pt alert  speech clear  pt ambulates with a can.

## 2023-12-14 NOTE — Discharge Instructions (Signed)
 Your evaluation in the emergency department was overall reassuring including a CT scan of your abdomen and pelvis.  I suspect you likely have a muscle strain of your abdominal wall.  I prescribed you several medications to help this happen-please take them as prescribed.  Please follow-up with your primary care provider for reevaluation, and return to the emergency department with any new or worsening symptoms.

## 2023-12-14 NOTE — ED Provider Notes (Signed)
 [facility] Provider Note    Event Date/Time   First MD Initiated Contact with Patient 12/14/23 2037     (approximate)   History   Abdominal Pain and Back Pain  Pt ambulatory to triage.  Pt has upper abd pain and back pain.  Pt has 2 hernias and is scheduled for surgery this week.  Pt has liver disease and reports pain in left chest.  Pt alert  speech clear  pt ambulates with a can.      HPI Shawn Sheppard is a 53 y.o. male PMH cirrhosis, OSA, prior kidney stones, GERD, ventral hernia, herniated lumbar disks presents for evaluation of multiple complaints - Appears patient is primarily here because he developed upper abdominal pain shortly after reaching up to grab an object.  Has been having some intermittent cramping upper abdominal pain and left lower chest pain over the past several days.  Not exertional (it actually feels much better when he is exercising). - Separately has been feeling somewhat intermittently lightheaded over the past few weeks as well as some generalized fatigue - No vomiting.  Last bowel movement today. - Has not taken any pain medications       Physical Exam   Triage Vital Signs: ED Triage Vitals  Encounter Vitals Group     BP 12/14/23 1751 (!) 140/97     Girls Systolic BP Percentile --      Girls Diastolic BP Percentile --      Boys Systolic BP Percentile --      Boys Diastolic BP Percentile --      Pulse Rate 12/14/23 1751 93     Resp 12/14/23 1751 20     Temp 12/14/23 1751 98 F (36.7 C)     Temp Source 12/14/23 1751 Oral     SpO2 12/14/23 1751 99 %     Weight 12/14/23 1752 195 lb (88.5 kg)     Height 12/14/23 1752 5' 10 (1.778 m)     Head Circumference --      Peak Flow --      Pain Score 12/14/23 1752 7     Pain Loc --      Pain Education --      Exclude from Growth Chart --     Most recent vital signs: Vitals:   12/14/23 2005 12/14/23 2325  BP: (!) 133/94 111/77  Pulse: 70 63  Resp: 18 16  Temp: 98.6 F  (37 C)   SpO2: 99% 100%     General: Awake, no distress.  CV:  Good peripheral perfusion. RRR, RP 2+ Resp:  Normal effort. CTAB Abd:  No distention. + Upper abdominal tenderness to palpation.     ED Results / Procedures / Treatments   Labs (all labs ordered are listed, but only abnormal results are displayed) Labs Reviewed  LIPASE, BLOOD - Abnormal; Notable for the following components:      Result Value   Lipase 101 (*)    All other components within normal limits  HEPATIC FUNCTION PANEL - Abnormal; Notable for the following components:   Total Bilirubin 1.3 (*)    Indirect Bilirubin 1.1 (*)    All other components within normal limits  BASIC METABOLIC PANEL WITH GFR  CBC  TROPONIN I (HIGH SENSITIVITY)  TROPONIN I (HIGH SENSITIVITY)     EKG  Ecg = sinus rhythm, rate 75, no gross ST elevation or depression, no significant repolarization abnormality, normal axis, normal intervals.  No evidence  of ischemia nor arrhythmia on my interpretation.   RADIOLOGY Radiology interpreted by myself and radiology reports reviewed.  No acute pathology identified.    PROCEDURES:  Critical Care performed: No  Procedures   MEDICATIONS ORDERED IN ED: Medications  ketorolac  (TORADOL ) 15 MG/ML injection 30 mg (30 mg Intravenous Given 12/14/23 2154)  iohexol  (OMNIPAQUE ) 300 MG/ML solution 100 mL (100 mLs Intravenous Contrast Given 12/14/23 2206)  methocarbamol  (ROBAXIN ) tablet 1,500 mg (1,500 mg Oral Given 12/14/23 2325)     IMPRESSION / MDM / ASSESSMENT AND PLAN / ED COURSE  I reviewed the triage vital signs and the nursing notes.                              DDX/MDM/AP: Differential diagnosis includes, but is not limited to, abdominal wall muscle strain, consider cholecystitis, pancreatitis, gastritis, dyspepsia, doubt perforated viscus.  Consider but doubt ACS.  Plan: - Labs - EKG - Chest x-ray - CT abdomen pelvis - Toradol  - Reassess  Patient's presentation is most  consistent with acute presentation with potential threat to life or bodily function.  The patient is on the cardiac monitor to evaluate for evidence of arrhythmia and/or significant heart rate changes.  ED course below.  Laboratory workup unremarkable, chest x-ray and CT abdomen pelvis abnormal.  EKG nonischemic.  Patient feeling notably better after Toradol .  Discussed reassuring findings, overall suspect likely MSK strain.  Given improvement of his recent vague intermittent chest discomfort with exercise I highly doubt cardiac etiology--did offer cardiology referral nonetheless though patient prefers to follow-up with his primary care provider which I believe is very reasonable.  He is scheduled to have hernia repair next week.  Plan for PMD follow-up.  ED return precautions in place.  Rx short course of Motrin  and methocarbamol  as well as Lidoderm  patches.  Patient agrees with plan.  Clinical Course as of 12/15/23 0001  Mon Dec 14, 2023  2205 LFTs reviewed, overall unremarkable, very mildly elevated bilirubin  Lipase mildly elevated [MM]  2227 CXR: IMPRESSION: No active cardiopulmonary disease.   [MM]  2227 CTAP: IMPRESSION: 1. No CT evidence for acute intra-abdominal or pelvic abnormality. 2. Mild diverticular disease of the colon without acute inflammatory process.   [MM]    Clinical Course User Index [MM] Clarine Ozell LABOR, MD     FINAL CLINICAL IMPRESSION(S) / ED DIAGNOSES   Final diagnoses:  Pain of upper abdomen  Muscle strain     Rx / DC Orders   ED Discharge Orders          Ordered    methocarbamol  (ROBAXIN ) 500 MG tablet  3 times daily        12/14/23 2309    ibuprofen  (MOTRIN  IB) 200 MG tablet  Every 8 hours PRN        12/14/23 2311    lidocaine  (LIDODERM ) 5 %  Every 24 hours        12/14/23 2311             Note:  This document was prepared using Dragon voice recognition software and may include unintentional dictation errors.   Clarine Ozell LABOR,  MD 12/15/23 0001

## 2023-12-17 ENCOUNTER — Other Ambulatory Visit: Payer: Self-pay

## 2023-12-17 ENCOUNTER — Ambulatory Visit: Payer: Self-pay | Admitting: Surgery

## 2023-12-17 ENCOUNTER — Encounter
Admission: RE | Admit: 2023-12-17 | Discharge: 2023-12-17 | Disposition: A | Discharge status: Home / Self Care | Source: Ambulatory Visit | Attending: Surgery | Admitting: Surgery

## 2023-12-17 HISTORY — DX: Ventral hernia without obstruction or gangrene: K43.9

## 2023-12-17 HISTORY — DX: Personal history of Methicillin resistant Staphylococcus aureus infection: Z86.14

## 2023-12-17 NOTE — Patient Instructions (Signed)
 Your procedure is scheduled on:12-24-23 Thursday Report to the Registration Desk on the 1st floor of the [facility].Then proceed to the 2nd floor Surgery Desk To find out your arrival time, please call [phone] between 1PM - 3PM on:12-23-23 Wednesday If your arrival time is 6:00 am, do not arrive before that time as the [facility] entrance doors do not open until 6:00 am.  REMEMBER: Instructions that are not followed completely may result in serious medical risk, up to and including death; or upon the discretion of your surgeon and anesthesiologist your surgery may need to be rescheduled.  Do not eat food after midnight the night before surgery.  No gum chewing or hard candies.  You may however, drink CLEAR liquids up to 2 hours before you are scheduled to arrive for your surgery. Do not drink anything within 2 hours of your scheduled arrival time.  Clear liquids include: - water  - apple juice without pulp - gatorade (not RED colors) - black coffee or tea (Do NOT add milk or creamers to the coffee or tea) Do NOT drink anything that is not on this list.  One week prior to surgery:Stop NOW (12-17-23) Stop Anti-inflammatories (NSAIDS) such as Advil , Aleve, Ibuprofen , Motrin , Naproxen, Naprosyn and Aspirin based products such as Excedrin, Goody's Powder, BC Powder. Stop ANY OVER THE COUNTER supplements until after surgery.  You may however, continue to take Tylenol  if needed for pain up until the day of surgery.  Stop tirzepatide 7 days prior to surgery-Do NOT take again until AFTER surgery  Continue taking all of your other prescription medications up until the day of surgery.  ON THE DAY OF SURGERY ONLY TAKE THESE MEDICATIONS WITH SIPS OF WATER: -levothyroxine (SYNTHROID, LEVOTHROID)   No Alcohol for 24 hours before or after surgery.  No Smoking including e-cigarettes for 24 hours before surgery.  No chewable tobacco products for at least 6 hours before surgery.  No  nicotine patches on the day of surgery.  Do not use any recreational drugs for at least a week (preferably 2 weeks) before your surgery.  Please be advised that the combination of cocaine and anesthesia may have negative outcomes, up to and including death. If you test positive for cocaine, your surgery will be cancelled.  On the morning of surgery brush your teeth with toothpaste and water, you may rinse your mouth with mouthwash if you wish. Do not swallow any toothpaste or mouthwash.  Use CHG Soap as directed on instruction sheet.  Do not wear jewelry, make-up, hairpins, clips or nail polish.  For welded (permanent) jewelry: bracelets, anklets, waist bands, etc.  Please have this removed prior to surgery.  If it is not removed, there is a chance that hospital personnel will need to cut it off on the day of surgery.  Do not wear lotions, powders, or perfumes.   Do not shave body hair from the neck down 48 hours before surgery.  Contact lenses, hearing aids and dentures may not be worn into surgery.  Do not bring valuables to the hospital. [facility] is not responsible for any missing/lost belongings or valuables.   Notify your doctor if there is any change in your medical condition (cold, fever, infection).  Wear comfortable clothing (specific to your surgery type) to the hospital.  After surgery, you can help prevent lung complications by doing breathing exercises.  Take deep breaths and cough every 1-2 hours. Your doctor may order a device called an Incentive Spirometer to help  you take deep breaths. When coughing or sneezing, hold a pillow firmly against your incision with both hands. This is called "splinting." Doing this helps protect your incision. It also decreases belly discomfort.  If you are being admitted to the hospital overnight, leave your suitcase in the car. After surgery it may be brought to your room.  In case of increased patient census, it may be necessary  for you, the patient, to continue your postoperative care in the Same Day Surgery department.  If you are being discharged the day of surgery, you will not be allowed to drive home. You will need a responsible individual to drive you home and stay with you for 24 hours after surgery.   If you are taking public transportation, you will need to have a responsible individual with you.  Please call the Pre-admissions Testing Dept. at [phone] if you have any questions about these instructions.  Surgery Visitation Policy:  Patients having surgery or a procedure may have two visitors.  Children under the age of 58 must have an adult with them who is not the patient.                                                                                                             Preparing for Surgery with CHLORHEXIDINE  GLUCONATE (CHG) Soap  Chlorhexidine  Gluconate (CHG) Soap  o An antiseptic cleaner that kills germs and bonds with the skin to continue killing germs even after washing  o Used for showering the night before surgery and morning of surgery  Before surgery, you can play an important role by reducing the number of germs on your skin.  CHG (Chlorhexidine  gluconate) soap is an antiseptic cleanser which kills germs and bonds with the skin to continue killing germs even after washing.  Please do not use if you have an allergy to CHG or antibacterial soaps. If your skin becomes reddened/irritated stop using the CHG.  1. Shower the NIGHT BEFORE SURGERY and the MORNING OF SURGERY with CHG soap.  2. If you choose to wash your hair, wash your hair first as usual with your normal shampoo.  3. After shampooing, rinse your hair and body thoroughly to remove the shampoo.  4. Use CHG as you would any other liquid soap. You can apply CHG directly to the skin and wash gently with a scrungie or a clean washcloth.  5. Apply the CHG soap to your body only from the neck down. Do not use on open  wounds or open sores. Avoid contact with your eyes, ears, mouth, and genitals (private parts). Wash face and genitals (private parts) with your normal soap.  6. Wash thoroughly, paying special attention to the area where your surgery will be performed.  7. Thoroughly rinse your body with warm water.  8. Do not shower/wash with your normal soap after using and rinsing off the CHG soap.  9. Pat yourself dry with a clean towel.  10. Wear clean pajamas to bed the night before surgery.  12. Place clean sheets  on your bed the night of your first shower and do not sleep with pets.  13. Shower again with the CHG soap on the day of surgery prior to arriving at the hospital.  14. Do not apply any deodorants/lotions/powders.  15. Please wear clean clothes to the hospital.   [employer] to address health-related social needs:  [email or web address]

## 2023-12-17 NOTE — H&P (View-Only) (Signed)
 Subjective:  CC: Ventral hernia with obstruction and without gangrene [K43.6]  HPI: referred by Norleen Reyes Maltos, MD for evaluation of above.  History of Present Illness Shawn Sheppard is a 53 year old male who presents with evaluation of hernia.  He has noticed two bumps over the past two weeks while performing crunch exercises. One bump is located below his xyphoid, and the other is right above his umbilicus. He experiences discomfort when straining, exercising, and bending down.  He has constipation, which he attributes to his medication Zepbound and hypothyroidism. He denies diarrhea, fever, chills, or reduction in appetite.  He experiences swelling in his left lateral hip area, which he thinks is fat.  He has been experiencing palpitations and shortness of breath recently.  Past Medical History: has a past medical history of Adhesive capsulitis of right shoulder (06/01/2020), Anxiety, generalized (07/28/2017), Cervical radiculitis (10/31/2015), Chronic back pain, Chronic insomnia (07/28/2017), DDD (degenerative disc disease), lumbosacral (12/15/2017), GERD (gastroesophageal reflux disease), Gout, joint (2021), Herniated lumbar disc without myelopathy (11/08/2014), History of kidney stones (10/31/2021), Hyperlipidemia, Midline low back pain with left-sided sciatica (11/18/2013), Rupture of right distal biceps tendon (03/08/2020), Sleep apnea, and Thyroid disease.  Past Surgical History: Past Surgical History: Procedure Laterality Date REDUCTION TESTICULAR TORSION Left 2006 Primary repair of ruptured distal biceps tendon, right elbow. Right 03/08/2020 Dr. Maltos ARTHROSCOPY SHOULDER Left 07/02/2023 Dr. Maltos VASECTOMY  Family History: family history includes Breast cancer in his mother; Cirrhosis in his maternal aunt and mother; Coronary Artery Disease (Blocked arteries around heart) in his maternal grandfather; Diabetes type II in his mother and paternal grandmother; Heart defect in  his son; High blood pressure (Hypertension) in his mother; Kidney disease in his mother; Nephrolithiasis in his father; Obesity in his father; Parkinsonism in his father; Stroke in his maternal grandfather.  Social History: reports that he has never smoked. He quit smokeless tobacco use about 7 months ago. His smokeless tobacco use included chew. He reports that he does not drink alcohol and does not use drugs.  Current Medications: has a current medication list which includes the following prescription(s): levothyroxine, ondansetron , oxycodone , and tirzepatide.  Allergies: Allergies as of 11/25/2023 - Reviewed 11/25/2023 Allergen Reaction Noted Hydrocodone -acetaminophen  Itching 06/18/2023 Ibuprofen  Other (See Comments) 06/24/2023 Amoxicillin-pot clavulanate Other (See Comments) 07/05/2015 Penicillins Rash 11/18/2013 Sulfa (sulfonamide antibiotics) Other (See Comments) 11/18/2013 Tramadol Itching 04/30/2020  ROS: A 15 point review of systems was performed and pertinent positives and negatives noted in HPI  Objective:   Ht 177.8 cm (5' 10)  Wt 91.5 kg (201 lb 11.2 oz)  BMI 28.94 kg/m  Constitutional : Alert, cooperative, no distress Lymphatics/Throat: Supple, no lymphadenopathy Respiratory: clear to auscultation bilaterally Cardiovascular: regular rate and rhythm Gastrointestinal: soft, non-tender; bowel sounds normal; no masses, no organomegaly. ventral hernia on CT not palpable on exam Musculoskeletal: Steady gait and movement Skin: Cool and moist. Fullness on left lateral hip consistent with fat deposits Psychiatric: Normal affect, non-agitated, not confused   LABS: N/a  RADS: Pending final read but obvious sub cm ventral and umbilical hernia Assessment:   Ventral hernia with obstruction and without gangrene [K43.6], due to inability to palpate on exam and two separate locations, recommended robotic assisted laparoscopic repair to address both at same time. No need for  f/u on fat deposit, left hip.  Plan:   1. Ventral hernia with obstruction and without gangrene [K43.6] Discussed the risk of surgery including recurrence, which can be up to 50% in the case of incisional or complex hernias,  possible use of prosthetic materials (mesh) and the increased risk of mesh infxn if used, bleeding, chronic pain, post-op infxn, post-op SBO or ileus, and possible re-operation to address said risks. The risks of general anesthetic, if used, includes MI, CVA, sudden death or even reaction to anesthetic medications also discussed. Alternatives include continued observation. Benefits include possible symptom relief, prevention of incarceration, strangulation, enlargement in size over time, and the risk of emergency surgery in the face of strangulation.  Typical post-op recovery time of 3-5 days with 2 weeks of activity restrictions were also discussed.  ED return precautions given for sudden increase in pain, size of hernia with accompanying fever, nausea, and/or vomiting.  The patient verbalized understanding and all questions were answered to the patient's satisfaction.  2. Patient has elected to proceed with surgical treatment. Procedure will be scheduled when patient ready to proceed after discussing schedule with wife  labs/images/medications/previous chart entries reviewed personally and relevant changes/updates noted above.   Electronically signed by Tye Millet, DO at 11/25/2023 3:47 PM EDT

## 2023-12-17 NOTE — H&P (Signed)
 Subjective:  CC: Ventral hernia with obstruction and without gangrene [K43.6]  HPI: referred by Norleen Reyes Maltos, MD for evaluation of above.  History of Present Illness Shawn Sheppard is a 53 year old male who presents with evaluation of hernia.  He has noticed two bumps over the past two weeks while performing crunch exercises. One bump is located below his xyphoid, and the other is right above his umbilicus. He experiences discomfort when straining, exercising, and bending down.  He has constipation, which he attributes to his medication Zepbound and hypothyroidism. He denies diarrhea, fever, chills, or reduction in appetite.  He experiences swelling in his left lateral hip area, which he thinks is fat.  He has been experiencing palpitations and shortness of breath recently.  Past Medical History: has a past medical history of Adhesive capsulitis of right shoulder (06/01/2020), Anxiety, generalized (07/28/2017), Cervical radiculitis (10/31/2015), Chronic back pain, Chronic insomnia (07/28/2017), DDD (degenerative disc disease), lumbosacral (12/15/2017), GERD (gastroesophageal reflux disease), Gout, joint (2021), Herniated lumbar disc without myelopathy (11/08/2014), History of kidney stones (10/31/2021), Hyperlipidemia, Midline low back pain with left-sided sciatica (11/18/2013), Rupture of right distal biceps tendon (03/08/2020), Sleep apnea, and Thyroid disease.  Past Surgical History: Past Surgical History: Procedure Laterality Date REDUCTION TESTICULAR TORSION Left 2006 Primary repair of ruptured distal biceps tendon, right elbow. Right 03/08/2020 Dr. Maltos ARTHROSCOPY SHOULDER Left 07/02/2023 Dr. Maltos VASECTOMY  Family History: family history includes Breast cancer in his mother; Cirrhosis in his maternal aunt and mother; Coronary Artery Disease (Blocked arteries around heart) in his maternal grandfather; Diabetes type II in his mother and paternal grandmother; Heart defect in  his son; High blood pressure (Hypertension) in his mother; Kidney disease in his mother; Nephrolithiasis in his father; Obesity in his father; Parkinsonism in his father; Stroke in his maternal grandfather.  Social History: reports that he has never smoked. He quit smokeless tobacco use about 7 months ago. His smokeless tobacco use included chew. He reports that he does not drink alcohol and does not use drugs.  Current Medications: has a current medication list which includes the following prescription(s): levothyroxine, ondansetron , oxycodone , and tirzepatide.  Allergies: Allergies as of 11/25/2023 - Reviewed 11/25/2023 Allergen Reaction Noted Hydrocodone -acetaminophen  Itching 06/18/2023 Ibuprofen  Other (See Comments) 06/24/2023 Amoxicillin-pot clavulanate Other (See Comments) 07/05/2015 Penicillins Rash 11/18/2013 Sulfa (sulfonamide antibiotics) Other (See Comments) 11/18/2013 Tramadol Itching 04/30/2020  ROS: A 15 point review of systems was performed and pertinent positives and negatives noted in HPI  Objective:   Ht 177.8 cm (5' 10)  Wt 91.5 kg (201 lb 11.2 oz)  BMI 28.94 kg/m  Constitutional : Alert, cooperative, no distress Lymphatics/Throat: Supple, no lymphadenopathy Respiratory: clear to auscultation bilaterally Cardiovascular: regular rate and rhythm Gastrointestinal: soft, non-tender; bowel sounds normal; no masses, no organomegaly. ventral hernia on CT not palpable on exam Musculoskeletal: Steady gait and movement Skin: Cool and moist. Fullness on left lateral hip consistent with fat deposits Psychiatric: Normal affect, non-agitated, not confused   LABS: N/a  RADS: Pending final read but obvious sub cm ventral and umbilical hernia Assessment:   Ventral hernia with obstruction and without gangrene [K43.6], due to inability to palpate on exam and two separate locations, recommended robotic assisted laparoscopic repair to address both at same time. No need for  f/u on fat deposit, left hip.  Plan:   1. Ventral hernia with obstruction and without gangrene [K43.6] Discussed the risk of surgery including recurrence, which can be up to 50% in the case of incisional or complex hernias,  possible use of prosthetic materials (mesh) and the increased risk of mesh infxn if used, bleeding, chronic pain, post-op infxn, post-op SBO or ileus, and possible re-operation to address said risks. The risks of general anesthetic, if used, includes MI, CVA, sudden death or even reaction to anesthetic medications also discussed. Alternatives include continued observation. Benefits include possible symptom relief, prevention of incarceration, strangulation, enlargement in size over time, and the risk of emergency surgery in the face of strangulation.  Typical post-op recovery time of 3-5 days with 2 weeks of activity restrictions were also discussed.  ED return precautions given for sudden increase in pain, size of hernia with accompanying fever, nausea, and/or vomiting.  The patient verbalized understanding and all questions were answered to the patient's satisfaction.  2. Patient has elected to proceed with surgical treatment. Procedure will be scheduled when patient ready to proceed after discussing schedule with wife  labs/images/medications/previous chart entries reviewed personally and relevant changes/updates noted above.   Electronically signed by Tye Millet, DO at 11/25/2023 3:47 PM EDT

## 2023-12-24 ENCOUNTER — Ambulatory Visit
Admission: RE | Admit: 2023-12-24 | Discharge: 2023-12-24 | Disposition: A | Discharge status: Home / Self Care | Attending: Surgery | Admitting: Surgery

## 2023-12-24 ENCOUNTER — Ambulatory Visit: Payer: Self-pay | Admitting: Anesthesiology

## 2023-12-24 ENCOUNTER — Encounter
Admission: RE | Disposition: A | Discharge status: Home / Self Care | Payer: Self-pay | Source: Home / Self Care | Attending: Surgery

## 2023-12-24 ENCOUNTER — Other Ambulatory Visit: Payer: Self-pay

## 2023-12-24 ENCOUNTER — Encounter: Payer: Self-pay | Admitting: Surgery

## 2023-12-24 DIAGNOSIS — G4733 Obstructive sleep apnea (adult) (pediatric): Secondary | ICD-10-CM | POA: Insufficient documentation

## 2023-12-24 DIAGNOSIS — K5900 Constipation, unspecified: Secondary | ICD-10-CM | POA: Insufficient documentation

## 2023-12-24 DIAGNOSIS — Z79899 Other long term (current) drug therapy: Secondary | ICD-10-CM | POA: Insufficient documentation

## 2023-12-24 DIAGNOSIS — K219 Gastro-esophageal reflux disease without esophagitis: Secondary | ICD-10-CM | POA: Insufficient documentation

## 2023-12-24 DIAGNOSIS — E039 Hypothyroidism, unspecified: Secondary | ICD-10-CM | POA: Diagnosis not present

## 2023-12-24 DIAGNOSIS — K436 Other and unspecified ventral hernia with obstruction, without gangrene: Secondary | ICD-10-CM | POA: Insufficient documentation

## 2023-12-24 DIAGNOSIS — K439 Ventral hernia without obstruction or gangrene: Secondary | ICD-10-CM

## 2023-12-24 HISTORY — PX: INSERTION OF MESH: SHX5868

## 2023-12-24 HISTORY — PX: XI ROBOTIC ASSISTED VENTRAL HERNIA: SHX6789

## 2023-12-24 SURGERY — REPAIR, HERNIA, VENTRAL, ROBOT-ASSISTED
Anesthesia: General | Site: Abdomen

## 2023-12-24 MED ORDER — ACETAMINOPHEN 500 MG PO TABS
ORAL_TABLET | ORAL | Status: AC
  Filled 2023-12-24: qty 2

## 2023-12-24 MED ORDER — ONDANSETRON HCL 4 MG/2ML IJ SOLN
INTRAMUSCULAR | Status: DC | PRN
  Administered 2023-12-24: 4 mg via INTRAVENOUS

## 2023-12-24 MED ORDER — OXYCODONE HCL 5 MG/5ML PO SOLN: 5.0000 mg | Freq: Once | ORAL | Status: AC | PRN

## 2023-12-24 MED ORDER — PROPOFOL 10 MG/ML IV BOLUS
INTRAVENOUS | Status: DC | PRN
  Administered 2023-12-24: 120 mg via INTRAVENOUS

## 2023-12-24 MED ORDER — EPHEDRINE 5 MG/ML INJ
INTRAVENOUS | Status: AC
Start: 2023-12-24 — End: 2023-12-24
  Filled 2023-12-24: qty 5

## 2023-12-24 MED ORDER — DROPERIDOL 2.5 MG/ML IJ SOLN
0.6250 mg | Freq: Once | INTRAMUSCULAR | Status: AC
  Administered 2023-12-24: 0.625 mg via INTRAVENOUS
  Filled 2023-12-24: qty 2

## 2023-12-24 MED ORDER — OXYCODONE HCL 5 MG PO TABS
5.0000 mg | ORAL_TABLET | Freq: Once | ORAL | Status: AC | PRN
  Administered 2023-12-24: 5 mg via ORAL

## 2023-12-24 MED ORDER — 0.9 % SODIUM CHLORIDE (POUR BTL) OPTIME
TOPICAL | Status: DC | PRN
  Administered 2023-12-24: 500 mL

## 2023-12-24 MED ORDER — ACETAMINOPHEN 500 MG PO TABS: 1000.0000 mg | ORAL_TABLET | ORAL | Status: DC

## 2023-12-24 MED ORDER — GABAPENTIN 300 MG PO CAPS
300.0000 mg | ORAL_CAPSULE | ORAL | Status: AC
Start: 2023-12-24 — End: 2023-12-24
  Administered 2023-12-24: 300 mg via ORAL

## 2023-12-24 MED ORDER — CHLORHEXIDINE GLUCONATE 0.12 % MT SOLN
15.0000 mL | Freq: Once | OROMUCOSAL | Status: AC
  Administered 2023-12-24: 15 mL via OROMUCOSAL

## 2023-12-24 MED ORDER — GLYCOPYRROLATE 0.2 MG/ML IJ SOLN
INTRAMUSCULAR | Status: DC | PRN
  Administered 2023-12-24: .2 mg via INTRAVENOUS

## 2023-12-24 MED ORDER — MIDAZOLAM HCL 2 MG/2ML IJ SOLN
INTRAMUSCULAR | Status: DC | PRN
  Administered 2023-12-24: 2 mg via INTRAVENOUS

## 2023-12-24 MED ORDER — LIDOCAINE HCL (PF) 2 % IJ SOLN
INTRAMUSCULAR | Status: AC
  Filled 2023-12-24: qty 5

## 2023-12-24 MED ORDER — OXYCODONE-ACETAMINOPHEN 5-325 MG PO TABS
1.0000 | ORAL_TABLET | Freq: Three times a day (TID) | ORAL | 0 refills | Status: AC | PRN
  Filled 2023-12-24: qty 6, 2d supply, fill #0

## 2023-12-24 MED ORDER — IBUPROFEN 800 MG PO TABS
800.0000 mg | ORAL_TABLET | Freq: Three times a day (TID) | ORAL | 0 refills | Status: AC | PRN
  Filled 2023-12-24: qty 30, 10d supply, fill #0

## 2023-12-24 MED ORDER — PROPOFOL 10 MG/ML IV BOLUS
INTRAVENOUS | Status: AC
  Filled 2023-12-24: qty 40

## 2023-12-24 MED ORDER — FENTANYL CITRATE (PF) 100 MCG/2ML IJ SOLN
INTRAMUSCULAR | Status: AC
  Filled 2023-12-24: qty 2

## 2023-12-24 MED ORDER — PHENYLEPHRINE 80 MCG/ML (10ML) SYRINGE FOR IV PUSH (FOR BLOOD PRESSURE SUPPORT)
PREFILLED_SYRINGE | INTRAVENOUS | Status: AC
  Filled 2023-12-24: qty 10

## 2023-12-24 MED ORDER — EPHEDRINE SULFATE-NACL 50-0.9 MG/10ML-% IV SOSY
PREFILLED_SYRINGE | INTRAVENOUS | Status: DC | PRN
  Administered 2023-12-24 (×3): 5 mg via INTRAVENOUS

## 2023-12-24 MED ORDER — ACETAMINOPHEN 325 MG PO TABS
650.0000 mg | ORAL_TABLET | Freq: Three times a day (TID) | ORAL | 0 refills | Status: AC | PRN
Start: 2023-12-24 — End: 2024-01-23
  Filled 2023-12-24: qty 40, 7d supply, fill #0

## 2023-12-24 MED ORDER — CHLORHEXIDINE GLUCONATE CLOTH 2 % EX PADS: 6.0000 | MEDICATED_PAD | Freq: Once | CUTANEOUS | Status: DC

## 2023-12-24 MED ORDER — DOCUSATE SODIUM 100 MG PO CAPS
100.0000 mg | ORAL_CAPSULE | Freq: Two times a day (BID) | ORAL | 0 refills | Status: AC | PRN
Start: 2023-12-24 — End: 2024-01-03
  Filled 2023-12-24: qty 20, 10d supply, fill #0

## 2023-12-24 MED ORDER — DEXAMETHASONE SODIUM PHOSPHATE 10 MG/ML IJ SOLN
INTRAMUSCULAR | Status: AC
  Filled 2023-12-24: qty 1

## 2023-12-24 MED ORDER — PHENYLEPHRINE 80 MCG/ML (10ML) SYRINGE FOR IV PUSH (FOR BLOOD PRESSURE SUPPORT)
PREFILLED_SYRINGE | INTRAVENOUS | Status: DC | PRN
  Administered 2023-12-24 (×5): 80 ug via INTRAVENOUS

## 2023-12-24 MED ORDER — HYDROMORPHONE HCL 1 MG/ML IJ SOLN
INTRAMUSCULAR | Status: DC | PRN
  Administered 2023-12-24 (×2): .5 mg via INTRAVENOUS

## 2023-12-24 MED ORDER — BUPIVACAINE-EPINEPHRINE (PF) 0.5% -1:200000 IJ SOLN
INTRAMUSCULAR | Status: DC | PRN
  Administered 2023-12-24: 20 mL

## 2023-12-24 MED ORDER — LACTATED RINGERS IV SOLN: INTRAVENOUS | Status: DC | PRN

## 2023-12-24 MED ORDER — CEFAZOLIN SODIUM-DEXTROSE 2-4 GM/100ML-% IV SOLN
INTRAVENOUS | Status: AC
  Filled 2023-12-24: qty 100

## 2023-12-24 MED ORDER — DEXMEDETOMIDINE HCL IN NACL 80 MCG/20ML IV SOLN
INTRAVENOUS | Status: AC
  Filled 2023-12-24: qty 20

## 2023-12-24 MED ORDER — KETAMINE HCL 50 MG/5ML IJ SOSY
PREFILLED_SYRINGE | INTRAMUSCULAR | Status: AC
  Filled 2023-12-24: qty 5

## 2023-12-24 MED ORDER — BUPIVACAINE-EPINEPHRINE (PF) 0.5% -1:200000 IJ SOLN
INTRAMUSCULAR | Status: AC
  Filled 2023-12-24: qty 30

## 2023-12-24 MED ORDER — GLYCOPYRROLATE 0.2 MG/ML IJ SOLN
INTRAMUSCULAR | Status: AC
Start: 2023-12-24 — End: 2023-12-24
  Filled 2023-12-24: qty 1

## 2023-12-24 MED ORDER — CHLORHEXIDINE GLUCONATE 0.12 % MT SOLN
OROMUCOSAL | Status: AC
Start: 2023-12-24 — End: 2023-12-24
  Filled 2023-12-24: qty 15

## 2023-12-24 MED ORDER — ROCURONIUM BROMIDE 100 MG/10ML IV SOLN
INTRAVENOUS | Status: DC | PRN
  Administered 2023-12-24: 60 mg via INTRAVENOUS
  Administered 2023-12-24: 20 mg via INTRAVENOUS
  Administered 2023-12-24: 10 mg via INTRAVENOUS

## 2023-12-24 MED ORDER — BUPIVACAINE-EPINEPHRINE 0.5% -1:200000 IJ SOLN: INTRAMUSCULAR | Status: DC | PRN

## 2023-12-24 MED ORDER — PHENYLEPHRINE 80 MCG/ML (10ML) SYRINGE FOR IV PUSH (FOR BLOOD PRESSURE SUPPORT)
PREFILLED_SYRINGE | INTRAVENOUS | Status: AC
Start: 2023-12-24 — End: 2023-12-24
  Filled 2023-12-24: qty 10

## 2023-12-24 MED ORDER — FENTANYL CITRATE (PF) 100 MCG/2ML IJ SOLN
25.0000 ug | INTRAMUSCULAR | Status: DC | PRN
  Administered 2023-12-24 (×4): 50 ug via INTRAVENOUS

## 2023-12-24 MED ORDER — LACTATED RINGERS IV SOLN: INTRAVENOUS | Status: DC

## 2023-12-24 MED ORDER — SUGAMMADEX SODIUM 200 MG/2ML IV SOLN
INTRAVENOUS | Status: DC | PRN
  Administered 2023-12-24: 175 mg via INTRAVENOUS
  Administered 2023-12-24: 25 mg via INTRAVENOUS

## 2023-12-24 MED ORDER — HYDROMORPHONE HCL 1 MG/ML IJ SOLN
INTRAMUSCULAR | Status: AC
  Filled 2023-12-24: qty 1

## 2023-12-24 MED ORDER — ROCURONIUM BROMIDE 10 MG/ML (PF) SYRINGE
PREFILLED_SYRINGE | INTRAVENOUS | Status: AC
  Filled 2023-12-24: qty 10

## 2023-12-24 MED ORDER — FENTANYL CITRATE (PF) 100 MCG/2ML IJ SOLN
INTRAMUSCULAR | Status: DC | PRN
  Administered 2023-12-24: 100 ug via INTRAVENOUS

## 2023-12-24 MED ORDER — DROPERIDOL 2.5 MG/ML IJ SOLN
INTRAMUSCULAR | Status: AC
  Filled 2023-12-24: qty 2

## 2023-12-24 MED ORDER — PHENYLEPHRINE HCL-NACL 20-0.9 MG/250ML-% IV SOLN
INTRAVENOUS | Status: DC | PRN
  Administered 2023-12-24: 20 ug/min via INTRAVENOUS

## 2023-12-24 MED ORDER — LIDOCAINE HCL (CARDIAC) PF 100 MG/5ML IV SOSY
PREFILLED_SYRINGE | INTRAVENOUS | Status: DC | PRN
  Administered 2023-12-24: 100 mg via INTRAVENOUS

## 2023-12-24 MED ORDER — DEXAMETHASONE SODIUM PHOSPHATE 10 MG/ML IJ SOLN
INTRAMUSCULAR | Status: DC | PRN
  Administered 2023-12-24: 10 mg via INTRAVENOUS

## 2023-12-24 MED ORDER — OXYCODONE HCL 5 MG PO TABS
ORAL_TABLET | ORAL | Status: AC
  Filled 2023-12-24: qty 1

## 2023-12-24 MED ORDER — KETAMINE HCL 50 MG/5ML IJ SOSY
PREFILLED_SYRINGE | INTRAMUSCULAR | Status: DC | PRN
  Administered 2023-12-24: 10 mg via INTRAVENOUS
  Administered 2023-12-24 (×2): 20 mg via INTRAVENOUS

## 2023-12-24 MED ORDER — MIDAZOLAM HCL 2 MG/2ML IJ SOLN
INTRAMUSCULAR | Status: AC
  Filled 2023-12-24: qty 2

## 2023-12-24 MED ORDER — GABAPENTIN 300 MG PO CAPS
ORAL_CAPSULE | ORAL | Status: AC
  Filled 2023-12-24: qty 1

## 2023-12-24 MED ORDER — ONDANSETRON HCL 4 MG/2ML IJ SOLN
INTRAMUSCULAR | Status: AC
  Filled 2023-12-24: qty 2

## 2023-12-24 MED ORDER — CEFAZOLIN SODIUM-DEXTROSE 2-4 GM/100ML-% IV SOLN
2.0000 g | INTRAVENOUS | Status: AC
Start: 2023-12-24 — End: 2023-12-24
  Administered 2023-12-24: 2 g via INTRAVENOUS

## 2023-12-24 MED ORDER — ORAL CARE MOUTH RINSE: 15.0000 mL | Freq: Once | OROMUCOSAL | Status: AC

## 2023-12-24 SURGICAL SUPPLY — 56 items
BINDER ABDOMINAL 12 ML 46-62 (SOFTGOODS) IMPLANT
BLADE SURG 15 STRL LF DISP TIS (BLADE) ×3 IMPLANT
CHLORAPREP W/TINT 26 (MISCELLANEOUS) IMPLANT
COVER TIP SHEARS 8 DVNC (MISCELLANEOUS) ×3 IMPLANT
COVER WAND RF STERILE (DRAPES) ×3 IMPLANT
DEFOGGER SCOPE WARM SEASHARP (MISCELLANEOUS) ×3 IMPLANT
DERMABOND ADVANCED .7 DNX12 (GAUZE/BANDAGES/DRESSINGS) ×3 IMPLANT
DRAPE ARM DVNC X/XI (DISPOSABLE) ×9 IMPLANT
DRAPE COLUMN DVNC XI (DISPOSABLE) ×3 IMPLANT
DRAPE LAPAROTOMY 77X122 PED (DRAPES) ×3 IMPLANT
ELECTRODE REM PT RTRN 9FT ADLT (ELECTROSURGICAL) ×3 IMPLANT
FORCEPS BPLR FENES DVNC XI (FORCEP) ×3 IMPLANT
GAUZE 4X4 16PLY ~~LOC~~+RFID DBL (SPONGE) IMPLANT
GLOVE BIOGEL PI IND STRL 7.0 (GLOVE) ×6 IMPLANT
GLOVE SURG SYN 6.5 PF PI (GLOVE) ×12 IMPLANT
GOWN STRL REUS W/ TWL LRG LVL3 (GOWN DISPOSABLE) ×12 IMPLANT
GRASPER SUT TROCAR 14GX15 (MISCELLANEOUS) IMPLANT
IRRIGATOR SUCT 8 DISP DVNC XI (IRRIGATION / IRRIGATOR) IMPLANT
IV NS 1000ML BAXH (IV SOLUTION) IMPLANT
KIT TURNOVER KIT A (KITS) ×3 IMPLANT
LABEL OR SOLS (LABEL) ×3 IMPLANT
MANIFOLD NEPTUNE II (INSTRUMENTS) ×3 IMPLANT
MESH PROGRIP HERNIA FLAT 15X15 (Mesh General) IMPLANT
NDL DRIVE SUT CUT DVNC (INSTRUMENTS) ×3 IMPLANT
NDL HYPO 22X1.5 SAFETY MO (MISCELLANEOUS) ×3 IMPLANT
NDL INSUFFLATION 14GA 120MM (NEEDLE) ×3 IMPLANT
NEEDLE DRIVE SUT CUT DVNC (INSTRUMENTS) ×3 IMPLANT
NEEDLE HYPO 22X1.5 SAFETY MO (MISCELLANEOUS) ×3 IMPLANT
NEEDLE INSUFFLATION 14GA 120MM (NEEDLE) ×3 IMPLANT
NS IRRIG 500ML POUR BTL (IV SOLUTION) ×3 IMPLANT
OBTURATOR OPTICALSTD 8 DVNC (TROCAR) ×3 IMPLANT
PACK BASIN MINOR ARMC (MISCELLANEOUS) ×3 IMPLANT
PACK LAP CHOLECYSTECTOMY (MISCELLANEOUS) ×3 IMPLANT
SCISSORS MNPLR CVD DVNC XI (INSTRUMENTS) ×3 IMPLANT
SEAL UNIV 5-12 XI (MISCELLANEOUS) ×9 IMPLANT
SET TUBE SMOKE EVAC HIGH FLOW (TUBING) ×3 IMPLANT
SOLUTION ELECTROSURG ANTI STCK (MISCELLANEOUS) ×3 IMPLANT
SUT ETHIBOND NAB MO 7 #0 18IN (SUTURE) ×3 IMPLANT
SUT MNCRL AB 4-0 PS2 18 (SUTURE) ×3 IMPLANT
SUT STRATA 3-0 15 RB-1.5 (SUTURE) IMPLANT
SUT STRATA 3-0 30 PS-1 (SUTURE) IMPLANT
SUT STRATA 3-0 SH (SUTURE) IMPLANT
SUT VIC AB 2-0 SH 27XBRD (SUTURE) ×3 IMPLANT
SUT VIC AB 3-0 SH 27X BRD (SUTURE) ×3 IMPLANT
SUT VICRYL 0 UR6 27IN ABS (SUTURE) ×3 IMPLANT
SUTURE MNCRL 4-0 27XMF (SUTURE) ×3 IMPLANT
SUTURE STRATFX 0 PDS+ CT-2 23 (SUTURE) ×3 IMPLANT
SYR 10ML LL (SYRINGE) ×6 IMPLANT
SYR 30ML LL (SYRINGE) ×3 IMPLANT
SYSTEM WECK SHIELD CLOSURE (TROCAR) IMPLANT
TAPE TRANSPORE STRL 2 31045 (GAUZE/BANDAGES/DRESSINGS) IMPLANT
TOWEL OR 17X26 4PK STRL BLUE (TOWEL DISPOSABLE) ×3 IMPLANT
TRAP FLUID SMOKE EVACUATOR (MISCELLANEOUS) ×3 IMPLANT
TRAY FOLEY MTR SLVR 16FR STAT (SET/KITS/TRAYS/PACK) ×3 IMPLANT
TROCAR Z-THREAD FIOS 5X100MM (TROCAR) IMPLANT
WATER STERILE IRR 500ML POUR (IV SOLUTION) ×3 IMPLANT

## 2023-12-24 NOTE — Anesthesia Procedure Notes (Signed)
 Procedure Name: Intubation Date/Time: 12/24/2023 7:44 AM  Performed by: Mathew Bernardino RAMAN, RNPre-anesthesia Checklist: Patient identified, Patient being monitored, Timeout performed, Emergency Drugs available and Suction available Patient Re-evaluated:Patient Re-evaluated prior to induction Oxygen Delivery Method: Circle system utilized Preoxygenation: Pre-oxygenation with 100% oxygen Induction Type: IV induction Ventilation: Mask ventilation without difficulty and Oral airway inserted - appropriate to patient size Laryngoscope Size: 3 and McGrath Grade View: Grade I Tube type: Oral Tube size: 7.5 mm Number of attempts: 1 Airway Equipment and Method: Stylet and Oral airway Placement Confirmation: ETT inserted through vocal cords under direct vision, positive ETCO2 and breath sounds checked- equal and bilateral Secured at: 23 cm Tube secured with: Tape Dental Injury: Teeth and Oropharynx as per pre-operative assessment

## 2023-12-24 NOTE — Interval H&P Note (Signed)
 No change. OK to proceed.

## 2023-12-24 NOTE — Discharge Instructions (Signed)
 Hernia repair, Care After ?This sheet gives you information about how to care for yourself after your procedure. Your health care provider may also give you more specific instructions. If you have problems or questions, contact your health care provider. ?What can I expect after the procedure? ?After your procedure, it is common to have the following: ?Pain in your abdomen, especially in the incision areas. You will be given medicine to control the pain. ?Tiredness. This is a normal part of the recovery process. Your energy level will return to normal over the next several weeks. ?Changes in your bowel movements, such as constipation or needing to go more often. Talk with your health care provider about how to manage this. ?Follow these instructions at home: ?Medicines ? tylenol and advil as needed for discomfort.  Please alternate between the two every four hours as needed for pain.   ? Use narcotics, if prescribed, only when tylenol and motrin is not enough to control pain. ? 325-650mg  every 8hrs to max of 3000mg /24hrs (including the 325mg  in every norco dose) for the tylenol.   ? Advil up to 800mg  per dose every 8hrs as needed for pain.   ?PLEASE RECORD NUMBER OF PILLS TAKEN UNTIL NEXT FOLLOW UP APPT.  THIS WILL HELP DETERMINE HOW READY YOU ARE TO BE RELEASED FROM ANY ACTIVITY RESTRICTIONS ?Do not drive or use heavy machinery while taking prescription pain medicine. ?Do not drink alcohol while taking prescription pain medicine. ? ?Incision care ? ?  ?Follow instructions from your health care provider about how to take care of your incision areas. Make sure you: ?Keep your incisions clean and dry. ?Wash your hands with soap and water before and after applying medicine to the areas, and before and after changing your bandage (dressing). If soap and water are not available, use hand sanitizer. ?Change your dressing as told by your health care provider. ?Leave stitches (sutures), skin glue, or adhesive strips in  place. These skin closures may need to stay in place for 2 weeks or longer. If adhesive strip edges start to loosen and curl up, you may trim the loose edges. Do not remove adhesive strips completely unless your health care provider tells you to do that. ?Do not wear tight clothing over the incisions. Tight clothing may rub and irritate the incision areas, which may cause the incisions to open. ?Do not take baths, swim, or use a hot tub until your health care provider approves. OK TO SHOWER IN 24HRS.   ?Check your incision area every day for signs of infection. Check for: ?More redness, swelling, or pain. ?More fluid or blood. ?Warmth. ?Pus or a bad smell. ?Activity ?Avoid lifting anything that is heavier than 10 lb (4.5 kg) for 2 weeks or until your health care provider says it is okay. ?No pushing/pulling greater than 30lbs ?You may resume normal activities as told by your health care provider. Ask your health care provider what activities are safe for you. ?Take rest breaks during the day as needed. ?Eating and drinking ?Follow instructions from your health care provider about what you can eat after surgery. ?To prevent or treat constipation while you are taking prescription pain medicine, your health care provider may recommend that you: ?Drink enough fluid to keep your urine clear or pale yellow. ?Take over-the-counter or prescription medicines. ?Eat foods that are high in fiber, such as fresh fruits and vegetables, whole grains, and beans. ?Limit foods that are high in fat and processed sugars, such as fried and  sweet foods. ?General instructions ?Ask your health care provider when you will need an appointment to get your sutures or staples removed. ?Keep all follow-up visits as told by your health care provider. This is important. ?Contact a health care provider if: ?You have more redness, swelling, or pain around your incisions. ?You have more fluid or blood coming from the incisions. ?Your incisions feel  warm to the touch. ?You have pus or a bad smell coming from your incisions or your dressing. ?You have a fever. ?You have an incision that breaks open (edges not staying together) after sutures or staples have been removed. ?You develop a rash. ?You have chest pain or difficulty breathing. ?You have pain or swelling in your legs. ?You feel light-headed or you faint. ?Your abdomen swells (becomes distended). ?You have nausea or vomiting. ?You have blood in your stool (feces). ?This information is not intended to replace advice given to you by your health care provider. Make sure you discuss any questions you have with your health care provider. ?Document Released: 10/11/2004 Document Revised: 12/11/2017 Document Reviewed: 12/24/2015 ?Elsevier Interactive Patient Education ? 2019 [employer] ?  ? ?

## 2023-12-24 NOTE — Anesthesia Postprocedure Evaluation (Signed)
 Anesthesia Post Note  Patient: Shawn Sheppard  Procedure(s) Performed: REPAIR, HERNIA, VENTRAL, ROBOT-ASSISTED (Abdomen) REPAIR, HERNIA, UMBILICAL, ROBOT-ASSISTED INSERTION OF MESH (Abdomen)  Patient location during evaluation: PACU Anesthesia Type: General Level of consciousness: awake and alert Pain management: pain level controlled Vital Signs Assessment: post-procedure vital signs reviewed and stable Respiratory status: spontaneous breathing, nonlabored ventilation and respiratory function stable Cardiovascular status: blood pressure returned to baseline and stable Postop Assessment: no apparent nausea or vomiting Anesthetic complications: no   No notable events documented.   Last Vitals:  Vitals:   12/24/23 1100 12/24/23 1115  BP: 117/70 113/65  Pulse: 89 94  Resp: 12 11  Temp:    SpO2: 97% 98%    Last Pain:  Vitals:   12/24/23 1100  TempSrc:   PainSc: 4                  Shawn Sheppard Shawn Sheppard

## 2023-12-24 NOTE — Transfer of Care (Signed)
 Immediate Anesthesia Transfer of Care Note  Patient: Shawn Sheppard  Procedure(s) Performed: REPAIR, HERNIA, VENTRAL, ROBOT-ASSISTED (Abdomen) REPAIR, HERNIA, UMBILICAL, ROBOT-ASSISTED INSERTION OF MESH (Abdomen)  Patient Location: PACU  Anesthesia Type:General  Level of Consciousness: awake, alert , and drowsy  Airway & Oxygen Therapy: Patient Spontanous Breathing and Patient connected to face mask oxygen  Post-op Assessment: Report given to RN and Post -op Vital signs reviewed and stable  Post vital signs: Reviewed and stable  Last Vitals:  Vitals Value Taken Time  BP 123/74 12/24/23 10:08  Temp 36.1 1008  Pulse 85 12/24/23 10:12  Resp 14 12/24/23 10:12  SpO2 100 % 12/24/23 10:12  Vitals shown include unfiled device data.  Last Pain:  Vitals:   12/24/23 9367  TempSrc: Temporal  PainSc: 2          Complications: No notable events documented.

## 2023-12-24 NOTE — Anesthesia Preprocedure Evaluation (Signed)
 Anesthesia Evaluation  Patient identified by MRN, date of birth, ID band Patient awake    Reviewed: Allergy & Precautions, NPO status , Patient's Chart, lab work & pertinent test results  History of Anesthesia Complications Negative for: history of anesthetic complications  Airway Mallampati: III  TM Distance: <3 FB Neck ROM: full    Dental  (+) Chipped   Pulmonary neg shortness of breath, sleep apnea    Pulmonary exam normal        Cardiovascular (-) angina (-) Past MI and (-) DOE negative cardio ROS Normal cardiovascular exam     Neuro/Psych negative neurological ROS  negative psych ROS   GI/Hepatic Neg liver ROS,GERD  Controlled,,  Endo/Other  Hypothyroidism    Renal/GU      Musculoskeletal   Abdominal   Peds  Hematology negative hematology ROS (+)   Anesthesia Other Findings Past Medical History: No date: Anxiety No date: Cirrhosis, non-alcoholic (HCC) No date: DDD (degenerative disc disease), lumbosacral No date: GERD (gastroesophageal reflux disease) No date: Gout, joint 2016: Herniated lumbar disc without myelopathy No date: History of kidney stones No date: History of methicillin resistant staphylococcus aureus (MRSA) No date: Hypothyroidism No date: Metabolic dysfunction-associated steatotic liver disease  (MASLD) No date: Obstructive sleep apnea     Comment:  lost weight and does not require cpap any longer No date: Spinal stenosis of lumbosacral region 06/2023: Superior labrum anterior-to-posterior (SLAP) tear of left  shoulder No date: Ventral hernia  Past Surgical History: 06/25/2021: CYSTOSCOPY W/ RETROGRADES; Left     Comment:  Procedure: CYSTOSCOPY WITH RETROGRADE PYELOGRAM;                Surgeon: Twylla Glendia BROCKS, MD;  Location: [facility];                Service: Urology;  Laterality: Left; 06/25/2021: CYSTOSCOPY/URETEROSCOPY/HOLMIUM LASER/STENT PLACEMENT; Left     Comment:  Procedure:  CYSTOSCOPY/URETEROSCOPY/HOLMIUM LASER/STONE               EXTRACTION/STENT PLACEMENT;  Surgeon: Twylla Glendia BROCKS,               MD;  Location: [facility];  Service: Urology;  Laterality:               Left; 03/08/2020: DISTAL BICEPS TENDON REPAIR; Right     Comment:  Procedure: DISTAL BICEPS TENDON REPAIR;  Surgeon: Edie Norleen PARAS, MD;  Location: [facility];  Service: Orthopedics;                Laterality: Right; 09/04/2022: EXTRACORPOREAL SHOCK WAVE LITHOTRIPSY; Right     Comment:  Procedure: EXTRACORPOREAL SHOCK WAVE LITHOTRIPSY (ESWL);              Surgeon: Francisca Redell BROCKS, MD;  Location: [facility];                Service: Urology;  Laterality: Right; 07/02/2023: POSTERIOR LUMBAR FUSION 2 WITH HARDWARE REMOVAL; Left     Comment:  Procedure: ARTHROSCOPY, SHOULDER WITH DEBRIDEMENT;                Surgeon: Edie Norleen PARAS, MD;  Location: [facility];                Service: Orthopedics;  Laterality: Left;  Open rotator               cuff repair and biceps tenodesis 2006: TESTICLE TORSION  REDUCTION; Left No date: VASECTOMY  BMI    Body Mass Index: 27.98 kg/m      Reproductive/Obstetrics negative OB ROS                              Anesthesia Physical Anesthesia Plan  ASA: 3  Anesthesia Plan: General ETT   Post-op Pain Management:    Induction: Intravenous  PONV Risk Score and Plan: Ondansetron , Dexamethasone , Midazolam  and Treatment may vary due to age or medical condition  Airway Management Planned: Oral ETT  Additional Equipment:   Intra-op Plan:   Post-operative Plan: Extubation in OR  Informed Consent: I have reviewed the patients History and Physical, chart, labs and discussed the procedure including the risks, benefits and alternatives for the proposed anesthesia with the patient or authorized representative who has indicated his/her understanding and acceptance.     Dental Advisory Given  Plan Discussed with: Anesthesiologist,  CRNA and Surgeon  Anesthesia Plan Comments: (Patient consented for risks of anesthesia including but not limited to:  - adverse reactions to medications - damage to eyes, teeth, lips or other oral mucosa - nerve damage due to positioning  - sore throat or hoarseness - Damage to heart, brain, nerves, lungs, other parts of body or loss of life  Patient voiced understanding and assent.)        Anesthesia Quick Evaluation

## 2023-12-24 NOTE — Op Note (Signed)
 Preoperative diagnosis: Umbilical and ventral hernia, initial reducible Postoperative diagnosis: same  Procedure: Robotic assisted laparoscopic umbilical and ventral hernia repair with mesh  Anesthesia: general  Surgeon: Henriette Pierre  Wound Classification: Clean  Specimen: none  Complications: None  Estimated Blood Loss: 10ml  Indications:see HPI  Findings: Umbilical and ventral hernia defect umbilical hernia measuring 1-1/2 cm x 2 cm.  Ventral hernia approximately 9-1/2 cm cephalad to the umbilical defect measured 1-1/2 x 1-1/2 cm. Tension free repair achieved with ProGrip mesh and suture 3. Adequate hemostasis  Description of procedure: The patient was brought to the operating room and general anesthesia was induced. A time-out was completed verifying correct patient, procedure, site, positioning, and implant(s) and/or special equipment prior to beginning this procedure. Antibiotics were administered prior to making the incision. SCDs placed. The anterior abdominal wall was prepped and draped in the standard sterile fashion.   Palmer's point chosen for entry.  Veress needle placed and abdomen insufflated to 15cm without any dramatic increase in pressure.  Needle removed and a 5mm port placed in the same area via Optiview technique.  No injury to the bowel was noted.  Under direct visualization two 8 mm ports and 112 mm port placed across the lower abdomen at the level of the ASIS, peritoneal avoiding the inferior epigastric vessels.   Xi robot docked. preperitoneal plane was entered by making a incision along the inferior aspect of the abdominal wall. this flap was carried across the abdomen toward the umbilical and the more cephalad ventral hernia.  Small amounts of bleeding was controlled with cautery.  Preperitoneal fat was noted to be herniating through the visible fascial defects.  This was all reduced to fully visualize the 2 defects.umbilical hernia measuring 1-1/2 cm x 2 cm.   Ventral hernia approximately 9-1/2 cm cephalad to the umbilical defect measured 1-1/2 x 1-1/2 cm.   Once adequate exposure of the defects and adequate space was created to place the mesh, insufflation dropped to 8mm and transfacial suture with 0 stratafix x2used to primarily close defects under minimal tension.     ProGrip mesh cut to size with adequate overlap around the defect edges.2 identical size mesh then placed over each defect and secured to the abdominal wall centered over the defecst. The peritoneal flap was then closed with a running 3-0 V lockx2 to ensure no defects were present afterwards and the 2 meshes were fully covered by the peritoneal flap.  Robot was undocked.  The 12mm cannula was removed and port site was closed using Efx shield device and 0 vicryl suture, ensuring no bowels were injured during this process.  Abdomen then desufflated while camera within abdomen to ensure no signs of new bleed prior to removing camera and rest of ports completely.  All skin incisions closed with runninrg 4-0 Monocryl in a subcuticular fashion.  All wounds then dressed with Dermabond.  Patient was then successfully awakened and transferred to PACU in stable condition.  At the end of the procedure sponge and instrument counts were correct.

## 2023-12-25 ENCOUNTER — Encounter: Payer: Self-pay | Admitting: Surgery

## 2023-12-26 ENCOUNTER — Emergency Department
Admission: EM | Admit: 2023-12-26 | Discharge: 2023-12-26 | Disposition: A | Discharge status: Home / Self Care | Source: Ambulatory Visit | Attending: Emergency Medicine | Admitting: Emergency Medicine

## 2023-12-26 ENCOUNTER — Emergency Department

## 2023-12-26 ENCOUNTER — Other Ambulatory Visit: Payer: Self-pay

## 2023-12-26 DIAGNOSIS — R1084 Generalized abdominal pain: Secondary | ICD-10-CM | POA: Insufficient documentation

## 2023-12-26 DIAGNOSIS — Z8719 Personal history of other diseases of the digestive system: Secondary | ICD-10-CM | POA: Diagnosis not present

## 2023-12-26 DIAGNOSIS — G8918 Other acute postprocedural pain: Secondary | ICD-10-CM | POA: Insufficient documentation

## 2023-12-26 LAB — CBC WITH DIFFERENTIAL/PLATELET
Abs Immature Granulocytes: 0.03 K/uL (ref 0.00–0.07)
Basophils Absolute: 0 K/uL (ref 0.0–0.1)
Basophils Relative: 0 %
Eosinophils Absolute: 0.1 K/uL (ref 0.0–0.5)
Eosinophils Relative: 2 %
HCT: 45.7 % (ref 39.0–52.0)
Hemoglobin: 15.8 g/dL (ref 13.0–17.0)
Immature Granulocytes: 0 %
Lymphocytes Relative: 26 %
Lymphs Abs: 2.4 K/uL (ref 0.7–4.0)
MCH: 31.2 pg (ref 26.0–34.0)
MCHC: 34.6 g/dL (ref 30.0–36.0)
MCV: 90.3 fL (ref 80.0–100.0)
Monocytes Absolute: 0.6 K/uL (ref 0.1–1.0)
Monocytes Relative: 6 %
Neutro Abs: 6.2 K/uL (ref 1.7–7.7)
Neutrophils Relative %: 66 %
Platelets: 168 K/uL (ref 150–400)
RBC: 5.06 MIL/uL (ref 4.22–5.81)
RDW: 12.9 % (ref 11.5–15.5)
WBC: 9.3 K/uL (ref 4.0–10.5)
nRBC: 0 % (ref 0.0–0.2)

## 2023-12-26 LAB — COMPREHENSIVE METABOLIC PANEL WITH GFR
ALT: 22 U/L (ref 0–44)
AST: 21 U/L (ref 15–41)
Albumin: 4.4 g/dL (ref 3.5–5.0)
Alkaline Phosphatase: 42 U/L (ref 38–126)
Anion gap: 12 (ref 5–15)
BUN: 14 mg/dL (ref 6–20)
CO2: 28 mmol/L (ref 22–32)
Calcium: 9.5 mg/dL (ref 8.9–10.3)
Chloride: 102 mmol/L (ref 98–111)
Creatinine, Ser: 0.87 mg/dL (ref 0.61–1.24)
GFR, Estimated: >60 mL/min (ref >60)
Glucose, Bld: 108 mg/dL — ABNORMAL HIGH (ref 70–99)
Potassium: 4.2 mmol/L (ref 3.5–5.1)
Sodium: 142 mmol/L (ref 135–145)
Total Bilirubin: 1.9 mg/dL — ABNORMAL HIGH (ref 0.0–1.2)
Total Protein: 7.2 g/dL (ref 6.5–8.1)

## 2023-12-26 LAB — LACTIC ACID, PLASMA: Lactic Acid, Venous: 1.7 mmol/L (ref 0.5–1.9)

## 2023-12-26 LAB — LIPASE, BLOOD: Lipase: 34 U/L (ref 11–51)

## 2023-12-26 MED ORDER — NABUMETONE 500 MG PO TABS: 500.0000 mg | ORAL_TABLET | Freq: Two times a day (BID) | ORAL | 0 refills | Status: AC

## 2023-12-26 MED ORDER — OXYCODONE HCL 10 MG PO TABS: 10.0000 mg | ORAL_TABLET | ORAL | 0 refills | Status: AC | PRN

## 2023-12-26 MED ORDER — IOHEXOL 300 MG/ML  SOLN
100.0000 mL | Freq: Once | INTRAMUSCULAR | Status: AC | PRN
  Administered 2023-12-26: 100 mL via INTRAVENOUS

## 2023-12-26 MED ORDER — FENTANYL CITRATE PF 50 MCG/ML IJ SOSY
50.0000 ug | PREFILLED_SYRINGE | Freq: Once | INTRAMUSCULAR | Status: AC
  Administered 2023-12-26: 50 ug via INTRAVENOUS
  Filled 2023-12-26: qty 1

## 2023-12-26 MED ORDER — LACTATED RINGERS IV BOLUS
1000.0000 mL | Freq: Once | INTRAVENOUS | Status: AC
Start: 2023-12-26 — End: 2023-12-26
  Administered 2023-12-26: 1000 mL via INTRAVENOUS

## 2023-12-26 MED ORDER — ONDANSETRON HCL 4 MG/2ML IJ SOLN
4.0000 mg | Freq: Four times a day (QID) | INTRAMUSCULAR | Status: DC | PRN
  Administered 2023-12-26: 4 mg via INTRAVENOUS
  Filled 2023-12-26: qty 2

## 2023-12-26 NOTE — ED Provider Notes (Signed)
 [facility] Provider Note   Event Date/Time   First MD Initiated Contact with Patient 12/26/23 1212     (approximate) History  Post-op Problem  HPI EMIR NACK is a 53 y.o. male with a past medical history of ventral hernia repair on 12/24/2023 who presents complaining of worsening generalized abdominal pain that is 10/10 and worse with any movement.  Patient states that this pain is also worse after eating and localizes to the upper portion of his abdomen.  Patient states that he has had some nausea however denies any diarrhea or constipation.  Patient has been using prescribed oxycodone  for the pain however states that this has not been enough and called his surgeons office today who recommended presenting to the emergency department for further evaluation given the severity of his pain. ROS: Patient currently denies any vision changes, tinnitus, difficulty speaking, facial droop, sore throat, chest pain, shortness of breath, nausea/vomiting/diarrhea, dysuria, or weakness/numbness/paresthesias in any extremity   Physical Exam  Triage Vital Signs: ED Triage Vitals  Encounter Vitals Group     BP 12/26/23 1152 118/79     Girls Systolic BP Percentile --      Girls Diastolic BP Percentile --      Boys Systolic BP Percentile --      Boys Diastolic BP Percentile --      Pulse Rate 12/26/23 1152 97     Resp 12/26/23 1152 17     Temp 12/26/23 1152 97.7 F (36.5 C)     Temp src --      SpO2 12/26/23 1152 96 %     Weight 12/26/23 1153 195 lb 1.7 oz (88.5 kg)     Height 12/26/23 1153 5' 10 (1.778 m)     Head Circumference --      Peak Flow --      Pain Score 12/26/23 1152 9     Pain Loc --      Pain Education --      Exclude from Growth Chart --    Most recent vital signs: Vitals:   12/26/23 1152  BP: 118/79  Pulse: 97  Resp: 17  Temp: 97.7 F (36.5 C)  SpO2: 96%   General: Awake, oriented x4. CV:  Good peripheral perfusion. Resp:  Normal  effort. Abd:  No distention.  Generalized erythema over the anterior abdomen.  Significant generalized tenderness to palpation with guarding Other:  Middle-aged overweight Caucasian male resting comfortably in no acute distress ED Results / Procedures / Treatments  Labs (all labs ordered are listed, but only abnormal results are displayed) Labs Reviewed  COMPREHENSIVE METABOLIC PANEL WITH GFR - Abnormal; Notable for the following components:      Result Value   Glucose, Bld 108 (*)    Total Bilirubin 1.9 (*)    All other components within normal limits  CBC WITH DIFFERENTIAL/PLATELET  LACTIC ACID, PLASMA  LIPASE, BLOOD  RADIOLOGY ED MD interpretation: CT of the abdomen and pelvis with IV contrast independently interpreted and shows no acute findings in the abdomen/pelvis.  There is postsurgical change over the umbilical region compatible with the patient's recent hernia repair with moderate air over the mid to lower anterior abdominal wall with collections of air over the prevesical space in the anterior pelvis as well as small amount of free peritoneal air over the upper abdomen - All radiology independently interpreted and agree with radiology assessment Official radiology report(s): CT ABDOMEN PELVIS W CONTRAST Result Date: 12/26/2023 CLINICAL DATA:  Abdominal pain and guarding. Patient is post hernia repair 12/24/2023. EXAM: CT ABDOMEN AND PELVIS WITH CONTRAST TECHNIQUE: Multidetector CT imaging of the abdomen and pelvis was performed using the standard protocol following bolus administration of intravenous contrast. RADIATION DOSE REDUCTION: This exam was performed according to the departmental dose-optimization program which includes automated exposure control, adjustment of the mA and/or kV according to patient size and/or use of iterative reconstruction technique. CONTRAST:  OMNIPAQUE  IOHEXOL  300 MG/ML  SOLN COMPARISON:  12/14/2023 FINDINGS: Lower chest: Bibasilar posterior  dependent atelectasis. Hepatobiliary: Liver, gallbladder and biliary tree are normal. Pancreas: Normal. Spleen: Normal. Adrenals/Urinary Tract: Adrenal glands are normal. Kidneys are normal in size without hydronephrosis or focal mass. Two small nonobstructing left renal stones are present with the larger over the upper pole measuring 3 mm. Ureters and bladder are normal. Stomach/Bowel: Stomach and small bowel are normal. Mild fecal retention throughout the colon which is otherwise normal. Appendix is normal. Vascular/Lymphatic: Abdominal aorta is normal in caliber. Remaining vascular structures are unremarkable. Reproductive: Prostate is unremarkable. Other: Postsurgical change over the umbilical region compatible patient's recent hernia repair. There is moderate air over the mid to lower anterior abdominal wall within the subcutaneous fat and rectus muscles. Collections of air over the pre vesicle space in the anterior pelvis. Small amount of free peritoneal air over the upper abdomen. These findings are all likely postsurgical given patient's very recent surgery minimal free fluid in the pelvis which again is likely postsurgical. Musculoskeletal: No focal abnormality. IMPRESSION: 1. No acute findings in the abdomen/pelvis. 2. Postsurgical change over the umbilical region compatible with patient's recent hernia repair. Moderate air over the mid to lower anterior abdominal wall with collections of air over the pre vesicle space in the anterior pelvis. Small amount of free peritoneal air over the upper abdomen. These findings are all likely postsurgical given patient's very recent surgery. 3. Two small nonobstructing left renal stones. Electronically Signed   By: Toribio Agreste M.D.   On: 12/26/2023 13:22   PROCEDURES: Critical Care performed: No Procedures MEDICATIONS ORDERED IN ED: Medications  ondansetron  (ZOFRAN ) injection 4 mg (4 mg Intravenous Given 12/26/23 1236)  fentaNYL  (SUBLIMAZE ) injection 50 mcg  (50 mcg Intravenous Given 12/26/23 1236)  lactated ringers  bolus 1,000 mL (0 mLs Intravenous Stopped 12/26/23 1409)  iohexol  (OMNIPAQUE ) 300 MG/ML solution 100 mL (100 mLs Intravenous Contrast Given 12/26/23 1301)  fentaNYL  (SUBLIMAZE ) injection 50 mcg (50 mcg Intravenous Given 12/26/23 1409)   IMPRESSION / MDM / ASSESSMENT AND PLAN / ED COURSE  I reviewed the triage vital signs and the nursing notes.                             The patient is on the cardiac monitor to evaluate for evidence of arrhythmia and/or significant heart rate changes. Patient's presentation is most consistent with acute presentation with potential threat to life or bodily function. Patient a 53 year old male with the above-stated past medical history presents 2 days after ventral hernia repair complaining of severe generalized abdominal pain with severe tenderness to palpation. DDx: Peritonitis, SBO, cellulitis, hernia mesh tear, postsurgical abscess Plan: CBC, CMP, lactate, lipase, CT abdomen/pelvis  Upon reassessment, patient was given results of the CT abdomen pelvis that do not show any evidence of acute abnormalities.  Patient's laboratory evaluation also shows no evidence of acute abnormalities.  Patient is pain well-controlled with 100 mcg of fentanyl .  Patient encouraged to increase his oxycodone  dose  over the next 2 to 3 days for acute postoperative pain.  Patient agrees with plan for discharge with increased oxycodone  as well as follow-up with his surgeon for further management of this pain.  Patient given strict return precautions and all questions were answered prior to discharge  Dispo: Discharge home with surgical follow-up   FINAL CLINICAL IMPRESSION(S) / ED DIAGNOSES   Final diagnoses:  Post-operative pain  Generalized abdominal pain  History of ventral hernia repair   Rx / DC Orders   ED Discharge Orders          Ordered    oxyCODONE  10 MG TABS  Every 4 hours PRN        12/26/23 1540     nabumetone  (RELAFEN ) 500 MG tablet  2 times daily        12/26/23 1540           Note:  This document was prepared using Dragon voice recognition software and may include unintentional dictation errors.   Jossie Artist POUR, MD 12/26/23 [phone]

## 2023-12-26 NOTE — ED Triage Notes (Signed)
 Pt comes in via pov   Pt had hernia surgery 9/18. Pt has been in severe pain, and reached out to the doctor with his concerns. PT was told to come to the ER and be seen. Pt complains of pain 9/10. Pt is alert and oriented x4. Pt is unable to sit down at this time.

## 2024-01-07 ENCOUNTER — Other Ambulatory Visit: Payer: Self-pay

## 2024-04-12 ENCOUNTER — Encounter: Payer: Self-pay | Admitting: Surgery

## 2024-05-11 ENCOUNTER — Other Ambulatory Visit: Payer: Self-pay | Admitting: Physician Assistant

## 2024-05-11 DIAGNOSIS — N200 Calculus of kidney: Secondary | ICD-10-CM
